# Patient Record
Sex: Female | Born: 1980 | Race: White | Hispanic: No | Marital: Married | Smoking: Never smoker
Health system: Southern US, Community
[De-identification: ages and names within clinical notes are randomized; demographics above are authoritative.]

## PROBLEM LIST (undated history)

## (undated) DIAGNOSIS — IMO0002 Reserved for concepts with insufficient information to code with codable children: Secondary | ICD-10-CM

## (undated) DIAGNOSIS — R0602 Shortness of breath: Secondary | ICD-10-CM

## (undated) DIAGNOSIS — B999 Unspecified infectious disease: Secondary | ICD-10-CM

## (undated) DIAGNOSIS — K219 Gastro-esophageal reflux disease without esophagitis: Secondary | ICD-10-CM

## (undated) DIAGNOSIS — J45909 Unspecified asthma, uncomplicated: Secondary | ICD-10-CM

## (undated) DIAGNOSIS — O4100X0 Oligohydramnios, unspecified trimester, not applicable or unspecified: Secondary | ICD-10-CM

## (undated) DIAGNOSIS — O4590 Premature separation of placenta, unspecified, unspecified trimester: Secondary | ICD-10-CM

## (undated) HISTORY — PX: COLPOSCOPY: SHX161

## (undated) HISTORY — DX: Shortness of breath: R06.02

## (undated) HISTORY — DX: Unspecified asthma, uncomplicated: J45.909

## (undated) HISTORY — PX: WISDOM TOOTH EXTRACTION: SHX21

## (undated) HISTORY — PX: LEEP: SHX91

## (undated) HISTORY — DX: Unspecified infectious disease: B99.9

## (undated) HISTORY — DX: Reserved for concepts with insufficient information to code with codable children: IMO0002

---

## 2007-06-15 DIAGNOSIS — R87619 Unspecified abnormal cytological findings in specimens from cervix uteri: Secondary | ICD-10-CM

## 2007-06-15 DIAGNOSIS — IMO0002 Reserved for concepts with insufficient information to code with codable children: Secondary | ICD-10-CM

## 2007-06-15 HISTORY — DX: Reserved for concepts with insufficient information to code with codable children: IMO0002

## 2007-06-15 HISTORY — DX: Unspecified abnormal cytological findings in specimens from cervix uteri: R87.619

## 2012-01-17 LAB — OB RESULTS CONSOLE HEPATITIS B SURFACE ANTIGEN: Hepatitis B Surface Ag: NEGATIVE

## 2012-01-17 LAB — OB RESULTS CONSOLE RPR: RPR: NONREACTIVE

## 2012-02-22 ENCOUNTER — Ambulatory Visit (INDEPENDENT_AMBULATORY_CARE_PROVIDER_SITE_OTHER): Payer: BC Managed Care – PPO | Admitting: Obstetrics and Gynecology

## 2012-02-22 ENCOUNTER — Other Ambulatory Visit: Payer: Self-pay | Admitting: Obstetrics and Gynecology

## 2012-02-22 ENCOUNTER — Ambulatory Visit (INDEPENDENT_AMBULATORY_CARE_PROVIDER_SITE_OTHER): Payer: BC Managed Care – PPO

## 2012-02-22 ENCOUNTER — Encounter: Payer: Self-pay | Admitting: Obstetrics and Gynecology

## 2012-02-22 VITALS — Wt 171.0 lb

## 2012-02-22 DIAGNOSIS — Z36 Encounter for antenatal screening of mother: Secondary | ICD-10-CM

## 2012-02-22 LAB — US OB COMP LESS 14 WKS

## 2012-02-22 NOTE — Progress Notes (Signed)
NOB interview. Pt transferring from [place] with records .Desires 1st trimester screen. Scheduled today.

## 2012-02-29 ENCOUNTER — Ambulatory Visit (INDEPENDENT_AMBULATORY_CARE_PROVIDER_SITE_OTHER): Payer: BC Managed Care – PPO | Admitting: Obstetrics and Gynecology

## 2012-02-29 ENCOUNTER — Encounter: Payer: Self-pay | Admitting: Obstetrics and Gynecology

## 2012-02-29 ENCOUNTER — Ambulatory Visit (INDEPENDENT_AMBULATORY_CARE_PROVIDER_SITE_OTHER): Payer: Self-pay | Admitting: Family

## 2012-02-29 ENCOUNTER — Encounter: Payer: Self-pay | Admitting: Family

## 2012-02-29 VITALS — BP 90/60 | Ht 70.0 in | Wt 173.0 lb

## 2012-02-29 VITALS — BP 118/80 | HR 97 | Temp 98.5°F | Ht 70.0 in | Wt 174.0 lb

## 2012-02-29 DIAGNOSIS — Z3400 Encounter for supervision of normal first pregnancy, unspecified trimester: Secondary | ICD-10-CM

## 2012-02-29 DIAGNOSIS — Z9889 Other specified postprocedural states: Secondary | ICD-10-CM | POA: Insufficient documentation

## 2012-02-29 DIAGNOSIS — J45909 Unspecified asthma, uncomplicated: Secondary | ICD-10-CM

## 2012-02-29 DIAGNOSIS — Z111 Encounter for screening for respiratory tuberculosis: Secondary | ICD-10-CM

## 2012-02-29 DIAGNOSIS — O3440 Maternal care for other abnormalities of cervix, unspecified trimester: Secondary | ICD-10-CM | POA: Insufficient documentation

## 2012-02-29 DIAGNOSIS — Z23 Encounter for immunization: Secondary | ICD-10-CM

## 2012-02-29 DIAGNOSIS — Z124 Encounter for screening for malignant neoplasm of cervix: Secondary | ICD-10-CM

## 2012-02-29 DIAGNOSIS — Z3689 Encounter for other specified antenatal screening: Secondary | ICD-10-CM

## 2012-02-29 NOTE — Progress Notes (Signed)
Subjective:    Patient ID: Krystal Stanley, female    DOB: 08/06/1980, 31 y.o.   MRN: [id number]  HPI 31 year old new patient is in to be established. She is in school for physical therapy assistant, and needs verification of MMR titer, varicella, hepatitis B. She is currently [redacted] weeks gestation. She's seeing OB/GYN for her gynecological care. Denies any concerns today.   Review of Systems  Constitutional: Negative.   HENT: Negative.   Respiratory: Negative.   Cardiovascular: Negative.   Gastrointestinal: Negative.   Genitourinary: Negative.   Musculoskeletal: Negative.   Skin: Negative.   Neurological: Negative.   Hematological: Negative.   Psychiatric/Behavioral: Negative.    Past Medical History  Diagnosis Date  . Infection CHILDHOOD    UTI  . Shortness of breath   . Asthma AGE 11    INHALER PRN  . Abnormal Pap smear 2009    COLPO; LEEP;  NL SINCE LAST PAP 04/2011    History   Social History  . Marital Status: Married    Spouse Name: ERIC    Number of Children: 0  . Years of Education: 18   Occupational History  . ATHLETIC TRAINER    Social History Main Topics  . Smoking status: Never Smoker   . Smokeless tobacco: Never Used  . Alcohol Use: No     BEER/WINE  D/'C'D WITH +UPT  . Drug Use: No  . Sexually Active: Yes -- Female partner(s)    Birth Control/ Protection: OCP, None     D/C'D 07/2011   Other Topics Concern  . Not on file   Social History Narrative  . No narrative on file    Past Surgical History  Procedure Date  . Wisdom tooth extraction AS TEEN    Family History  Problem Relation Age of Onset  . Early death Brother 35    ACCIDENT  . Rheum arthritis Maternal Aunt   . Arthritis Maternal Grandmother   . Hyperlipidemia Maternal Grandmother   . Heart disease Maternal Grandmother   . Arthritis Maternal Grandfather   . Alcohol abuse Maternal Grandfather   . Arthritis Paternal Grandmother   . Arthritis Paternal Grandfather   . Alcohol abuse  Paternal Grandfather   . Hyperlipidemia Paternal Grandfather   . Heart disease Paternal Grandfather   . Fibromyalgia Maternal Aunt     Allergies  Allergen Reactions  . Other     SEASONAL AND PET DANDER - ASTHMA ; HIVES, ITCHING, WATERY EYES    Current Outpatient Prescriptions on File Prior to Visit  Medication Sig Dispense Refill  . ALBUTEROL SULFATE HFA IN Inhale 1 puff into the lungs as needed.      . cetirizine (ZYRTEC) 10 MG tablet Take 10 mg by mouth daily.      . montelukast (SINGULAIR) 10 MG tablet Take 10 mg by mouth at bedtime.      . Prenatal Vit-Fe Fumarate-FA (PRENATAL 19 PO) Take 1 tablet by mouth. OTC        BP 118/80  Pulse 97  Temp 98.5 F (36.9 C) (Oral)  Ht 5\' 10"  (1.778 m)  Wt 174 lb (78.926 kg)  BMI 24.97 kg/m2  SpO2 99%  LMP 06/13/2013chart    Objective:   Physical Exam  Constitutional: She is oriented to person, place, and time. She appears well-developed and well-nourished.  HENT:  Right Ear: External ear normal.  Left Ear: External ear normal.  Nose: Nose normal.  Mouth/Throat: Oropharynx is clear and moist.  Neck: Normal range  of motion. Neck supple.  Cardiovascular: Normal rate, regular rhythm and normal heart sounds.   Pulmonary/Chest: Effort normal and breath sounds normal.  Abdominal: Soft. Bowel sounds are normal.  Musculoskeletal: Normal range of motion.  Neurological: She is alert and oriented to person, place, and time.  Skin: Skin is warm and dry.  Psychiatric: She has a normal mood and affect.      Influenza vaccine administered  Tetanus administered    Assessment & Plan:  Assessment: Pregnancy, need for vaccination  Plan: Lab sent to include MMR,  Varicella, hep B titer all sent. Will notify patient pending results. Continue to followup with OB for obstetric needs. Patient to call the office with any questions or concerns. Recheck a schedule, appearing.

## 2012-02-29 NOTE — Progress Notes (Signed)
Records attached to chart Pap due in Oct per pt

## 2012-02-29 NOTE — Addendum Note (Signed)
Addended by: Janeece Agee on: 02/29/2012 10:38 AM   Modules accepted: Orders

## 2012-02-29 NOTE — Addendum Note (Signed)
Addended by: Janeece Agee on: 02/29/2012 12:36 PM   Modules accepted: Orders

## 2012-02-29 NOTE — Progress Notes (Signed)
Subjective:    Krystal Stanley is being seen today for her first obstetrical visit at [redacted]w[redacted]d gestation by LMP and 1st trimester screen. Transfer in from [place]--records with patient and reviewed.  No issues.  Seeing primary MD at [facility] for initial establishment visit.  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  She reports no issues.  Her obstetrical history is significant for: Patient Active Problem List  Diagnosis  . Hx LEEP (loop electrosurgical excision procedure), cervix, pregnancy  2009, normal paps since. Pap due in Sept or October.  Relationship with FOB:  Married  Patient does intend to breast feed.   Pregnancy history fully reviewed.  The following portions of the patient's history were reviewed and updated as appropriate: allergies, current medications, past family history, past medical history, past social history, past surgical history and problem list.  Review of Systems Pertinent ROS is described in HPI   Objective:   BP 90/60  Ht 5\' 10"  (1.778 m)  Wt 173 lb (78.472 kg)  BMI 24.82 kg/m2  LMP 11/25/2011 Wt Readings from Last 1 Encounters:  02/29/12 173 lb (78.472 kg)   BMI: Body mass index is 24.82 kg/(m^2).  General: alert, cooperative and no distress Respiratory: clear to auscultation bilaterally Cardiovascular: regular rate and rhythm, S1, S2 normal, no murmur Breasts:  No dominant masses, nipples erect Gastrointestinal: soft, non-tender; no masses,  no organomegaly Extremities: extremities normal, no pain or edema Vaginal Bleeding: None  EXTERNAL GENITALIA: normal appearing vulva with no masses, tenderness or lesions VAGINA: no abnormal discharge or lesions CERVIX: no lesions or cervical motion tenderness; cervix closed, long, firm. UTERUS: gravid and consistent with 13 weeks, but retroverted. Used bedside US for FHR check--+FM, FHR 160. ADNEXA: no masses palpable and nontender OB EXAM  PELVIMETRY: appears adequate   FHR:  160  bpm  Assessment:    Pregnancy at  13 5/7 weeks Hx LEEP 2009 Transfer in from [place] records. Plan:     Prenatal panel reviewed and discussed with the patient:yes Pap smear collected:yes GC/Chlamydia collected:  Declined at previous office. Wet prep:  NA Discussion of Genetic testing options: Had 1st trimester screen (pending), plans AFP Prenatal vitamins recommended Problem list reviewed and updated.  Plan of care: Follow up in 4 weeks for anatomy US and ROB Pap today. AFP NV.  Nigel Bridgeman CNM, [place] 02/29/2012 10:02 AM

## 2012-03-02 LAB — TB SKIN TEST: TB Skin Test: NEGATIVE

## 2012-03-02 LAB — PAP IG, CT-NG, RFX HPV ASCU

## 2012-03-08 ENCOUNTER — Telehealth: Payer: Self-pay | Admitting: *Deleted

## 2012-03-08 NOTE — Telephone Encounter (Signed)
Called patient and left a message. She needs to come back in for more blood work.

## 2012-03-10 NOTE — Addendum Note (Signed)
Addended by: Rita Ohara R on: 03/10/2012 01:24 PM   Modules accepted: Orders

## 2012-03-16 ENCOUNTER — Telehealth: Payer: Self-pay | Admitting: Family

## 2012-03-16 NOTE — Telephone Encounter (Signed)
Pt would like a copy of blood work hep b titer fax to her at [phone] today

## 2012-03-16 NOTE — Telephone Encounter (Signed)
Labs faxed to number provided by pt

## 2012-03-30 ENCOUNTER — Encounter: Payer: Self-pay | Admitting: Obstetrics and Gynecology

## 2012-03-30 ENCOUNTER — Ambulatory Visit (INDEPENDENT_AMBULATORY_CARE_PROVIDER_SITE_OTHER): Payer: BC Managed Care – PPO

## 2012-03-30 ENCOUNTER — Ambulatory Visit (INDEPENDENT_AMBULATORY_CARE_PROVIDER_SITE_OTHER): Payer: BC Managed Care – PPO | Admitting: Obstetrics and Gynecology

## 2012-03-30 VITALS — BP 100/62 | Wt 176.0 lb

## 2012-03-30 DIAGNOSIS — Z3689 Encounter for other specified antenatal screening: Secondary | ICD-10-CM

## 2012-03-30 DIAGNOSIS — Z331 Pregnant state, incidental: Secondary | ICD-10-CM

## 2012-03-30 DIAGNOSIS — Z3490 Encounter for supervision of normal pregnancy, unspecified, unspecified trimester: Secondary | ICD-10-CM

## 2012-03-30 MED ORDER — MONTELUKAST SODIUM 10 MG PO TABS: 10.0000 mg | ORAL_TABLET | Freq: Every day | ORAL | Status: DC

## 2012-03-30 NOTE — Addendum Note (Signed)
Addended by: Marla Roe A on: 03/30/2012 10:14 AM   Modules accepted: Orders

## 2012-03-30 NOTE — Progress Notes (Addendum)
[redacted]w[redacted]d Reviewed PNL AFP today Questions answered RTO 4wks Ultrasound today consistent with dates and cervix 3.2 cm normal fluid no anomaliesl posterior placenta female

## 2012-04-03 LAB — ALPHA FETOPROTEIN, MATERNAL
Open Spina bifida: NEGATIVE
Osb Risk: 1:3700 {titer}

## 2012-04-04 ENCOUNTER — Other Ambulatory Visit: Payer: Self-pay | Admitting: Obstetrics and Gynecology

## 2012-04-04 ENCOUNTER — Telehealth: Payer: Self-pay | Admitting: Obstetrics and Gynecology

## 2012-04-04 NOTE — Telephone Encounter (Signed)
Tc to pt regarding msg.  Lm on vm to call back. 

## 2012-04-05 ENCOUNTER — Other Ambulatory Visit: Payer: Self-pay | Admitting: Obstetrics and Gynecology

## 2012-04-05 LAB — US OB COMP + 14 WK

## 2012-04-05 NOTE — Telephone Encounter (Signed)
Pt returned call regarding msg.  Informed pt rx was e-prescribed to her pharmacy on 03/30/12 by AR, pt says she did not get a call from pharmacy, advised to call her pharmacy to make sure that they did receive it, but it was completed and shows that it went through.  Pt voices agreement.

## 2012-04-11 ENCOUNTER — Telehealth: Payer: Self-pay

## 2012-04-17 NOTE — Telephone Encounter (Signed)
Note to close encounter only. Sina Lucchesi, Jacqueline A  

## 2012-04-26 ENCOUNTER — Ambulatory Visit (INDEPENDENT_AMBULATORY_CARE_PROVIDER_SITE_OTHER): Payer: BC Managed Care – PPO | Admitting: Obstetrics and Gynecology

## 2012-04-26 ENCOUNTER — Encounter: Payer: Self-pay | Admitting: Obstetrics and Gynecology

## 2012-04-26 VITALS — BP 118/62 | Wt 181.0 lb

## 2012-04-26 DIAGNOSIS — Z331 Pregnant state, incidental: Secondary | ICD-10-CM

## 2012-04-26 NOTE — Patient Instructions (Signed)
ABCs of Pregnancy  A  Antepartum care is very important. Be sure you see your doctor and get prenatal care as soon as you think you are pregnant. At this time, you will be tested for infection, genetic abnormalities and potential problems with you and the pregnancy. This is the time to discuss diet, exercise, work, medications, labor, pain medication during labor and the possibility of a cesarean delivery. Ask any questions that may concern you. It is important to see your doctor regularly throughout your pregnancy. Avoid exposure to toxic substances and chemicals - such as cleaning solvents, lead and mercury, some insecticides, and paint. Pregnant women should avoid exposure to paint fumes, and fumes that cause you to feel ill, dizzy or faint. When possible, it is a good idea to have a pre-pregnancy consultation with your caregiver to begin some important recommendations your caregiver suggests such as, taking folic acid, exercising, quitting smoking, avoiding alcoholic beverages, etc.  B  Breastfeeding is the healthiest choice for both you and your baby. It has many nutritional benefits for the baby and health benefits for the mother. It also creates a very tight and loving bond between the baby and mother. Talk to your doctor, your family and friends, and your employer about how you choose to feed your baby and how they can support you in your decision. Not all birth defects can be prevented, but a woman can take actions that may increase her chance of having a healthy baby. Many birth defects happen very early in pregnancy, sometimes before a woman even knows she is pregnant. Birth defects or abnormalities of any child in your or the father's family should be discussed with your caregiver. Get a good support bra as your breast size changes. Wear it especially when you exercise and when nursing.   C  Celebrate the news of your pregnancy with the your spouse/father and family. Childbirth classes are helpful to  take for you and the spouse/father because it helps to understand what happens during the pregnancy, labor and delivery. Cesarean delivery should be discussed with your doctor so you are prepared for that possibility. The pros and cons of circumcision if it is a boy, should be discussed with your pediatrician. Cigarette smoking during pregnancy can result in low birth weight babies. It has been associated with infertility, miscarriages, tubal pregnancies, infant death (mortality) and poor health (morbidity) in childhood. Additionally, cigarette smoking may cause long-term learning disabilities. If you smoke, you should try to quit before getting pregnant and not smoke during the pregnancy. Secondary smoke may also harm a mother and her developing baby. It is a good idea to ask people to stop smoking around you during your pregnancy and after the baby is born. Extra calcium is necessary when you are pregnant and is found in your prenatal vitamin, in dairy products, green leafy vegetables and in calcium supplements.  D  A healthy diet according to your current weight and height, along with vitamins and mineral supplements should be discussed with your caregiver. Domestic abuse or violence should be made known to your doctor right away to get the situation corrected. Drink more water when you exercise to keep hydrated. Discomfort of your back and legs usually develops and progresses from the middle of the second trimester through to delivery of the baby. This is because of the enlarging baby and uterus, which may also affect your balance. Do not take illegal drugs. Illegal drugs can seriously harm the baby and you. Drink extra   fluids (water is best) throughout pregnancy to help your body keep up with the increases in your blood volume. Drink at least 6 to 8 glasses of water, fruit juice, or milk each day. A good way to know you are drinking enough fluid is when your urine looks almost like clear water or is very light  yellow.   E  Eat healthy to get the nutrients you and your unborn baby need. Your meals should include the five basic food groups. Exercise (30 minutes of light to moderate exercise a day) is important and encouraged during pregnancy, if there are no medical problems or problems with the pregnancy. Exercise that causes discomfort or dizziness should be stopped and reported to your caregiver. Emotions during pregnancy can change from being ecstatic to depression and should be understood by you, your partner and your family.  F  Fetal screening with ultrasound, amniocentesis and monitoring during pregnancy and labor is common and sometimes necessary. Take 400 micrograms of folic acid daily both before, when possible, and during the first few months of pregnancy to reduce the risk of birth defects of the brain and spine. All women who could possibly become pregnant should take a vitamin with folic acid, every day. It is also important to eat a healthy diet with fortified foods (enriched grain products, including cereals, rice, breads, and pastas) and foods with natural sources of folate (orange juice, green leafy vegetables, beans, peanuts, broccoli, asparagus, peas, and lentils). The father should be involved with all aspects of the pregnancy including, the prenatal care, childbirth classes, labor, delivery, and postpartum time. Fathers may also have emotional concerns about being a father, financial needs, and raising a family.  G  Genetic testing should be done appropriately. It is important to know your family and the father's history. If there have been problems with pregnancies or birth defects in your family, report these to your doctor. Also, genetic counselors can talk with you about the information you might need in making decisions about having a family. You can call a major medical center in your area for help in finding a board-certified genetic counselor. Genetic testing and counseling should be done  before pregnancy when possible, especially if there is a history of problems in the mother's or father's family. Certain ethnic backgrounds are more at risk for genetic defects.  H  Get familiar with the hospital where you will be having your baby. Get to know how long it takes to get there, the labor and delivery area, and the hospital procedures. Be sure your medical insurance is accepted there. Get your home ready for the baby including, clothes, the baby's room (when possible), furniture and car seat. Hand washing is important throughout the day, especially after handling raw meat and poultry, changing the baby's diaper or using the bathroom. This can help prevent the spread of many bacteria and viruses that cause infection. Your hair may become dry and thinner, but will return to normal a few weeks after the baby is born. Heartburn is a common problem that can be treated by taking antacids recommended by your caregiver, eating smaller meals 5 or 6 times a day, not drinking liquids when eating, drinking between meals and raising the head of your bed 2 to 3 inches.  I  Insurance to cover you, the baby, doctor and hospital should be reviewed so that you will be prepared to pay any costs not covered by your insurance plan. If you do not have medical insurance,   there are usually clinics and services available for you in your community. Take 30 milligrams of iron during your pregnancy as prescribed by your doctor to reduce the risk of low red blood cells (anemia) later in pregnancy. All women of childbearing age should eat a diet rich in iron.  J  There should be a joint effort for the mother, father and any other children to adapt to the pregnancy financially, emotionally, and psychologically during the pregnancy. Join a support group for moms-to-be. Or, join a class on parenting or childbirth. Have the family participate when possible.  K  Know your limits. Let your caregiver know if you experience any of the  following:   · Pain of any kind.  · Strong cramps.  · You develop a lot of weight in a short period of time (5 pounds in 3 to 5 days).  · Vaginal bleeding, leaking of amniotic fluid.  · Headache, vision problems.  · Dizziness, fainting, shortness of breath.  · Chest pain.  · Fever of 102° F (38.9° C) or higher.  · Gush of clear fluid from your vagina.  · Painful urination.  · Domestic violence.  · Irregular heartbeat (palpitations).  · Rapid beating of the heart (tachycardia).  · Constant feeling sick to your stomach (nauseous) and vomiting.  · Trouble walking, fluid retention (edema).  · Muscle weakness.  · If your baby has decreased activity.  · Persistent diarrhea.  · Abnormal vaginal discharge.  · Uterine contractions at 20-minute intervals.  · Back pain that travels down your leg.  L  Learn and practice that what you eat and drink should be in moderation and healthy for you and your baby. Legal drugs such as alcohol and caffeine are important issues for pregnant women. There is no safe amount of alcohol a woman can drink while pregnant. Fetal alcohol syndrome, a disorder characterized by growth retardation, facial abnormalities, and central nervous system dysfunction, is caused by a woman's use of alcohol during pregnancy. Caffeine, found in tea, coffee, soft drinks and chocolate, should also be limited. Be sure to read labels when trying to cut down on caffeine during pregnancy. More than 200 foods, beverages, and over-the-counter medications contain caffeine and have a high salt content! There are coffees and teas that do not contain caffeine.  M  Medical conditions such as diabetes, epilepsy, and high blood pressure should be treated and kept under control before pregnancy when possible, but especially during pregnancy. Ask your caregiver about any medications that may need to be changed or adjusted during pregnancy. If you are currently taking any medications, ask your caregiver if it is safe to take them  while you are pregnant or before getting pregnant when possible. Also, be sure to discuss any herbs or vitamins you are taking. They are medicines, too! Discuss with your doctor all medications, prescribed and over-the-counter, that you are taking. During your prenatal visit, discuss the medications your doctor may give you during labor and delivery.  N  Never be afraid to ask your doctor or caregiver questions about your health, the progress of the pregnancy, family problems, stressful situations, and recommendation for a pediatrician, if you do not have one. It is better to take all precautions and discuss any questions or concerns you may have during your office visits. It is a good idea to write down your questions before you visit the doctor.  O  Over-the-counter cough and cold remedies may contain alcohol or other ingredients that should   be avoided during pregnancy. Ask your caregiver about prescription, herbs or over-the-counter medications that you are taking or may consider taking while pregnant.   P  Physical activity during pregnancy can benefit both you and your baby by lessening discomfort and fatigue, providing a sense of well-being, and increasing the likelihood of early recovery after delivery. Light to moderate exercise during pregnancy strengthens the belly (abdominal) and back muscles. This helps improve posture. Practicing yoga, walking, swimming, and cycling on a stationary bicycle are usually safe exercises for pregnant women. Avoid scuba diving, exercise at high altitudes (over 3000 feet), skiing, horseback riding, contact sports, etc. Always check with your doctor before beginning any kind of exercise, especially during pregnancy and especially if you did not exercise before getting pregnant.  Q  Queasiness, stomach upset and morning sickness are common during pregnancy. Eating a couple of crackers or dry toast before getting out of bed. Foods that you normally love may make you feel sick to  your stomach. You may need to substitute other nutritious foods. Eating 5 or 6 small meals a day instead of 3 large ones may make you feel better. Do not drink with your meals, drink between meals. Questions that you have should be written down and asked during your prenatal visits.  R  Read about and make plans to baby-proof your home. There are important tips for making your home a safer environment for your baby. Review the tips and make your home safer for you and your baby. Read food labels regarding calories, salt and fat content in the food.  S  Saunas, hot tubs, and steam rooms should be avoided while you are pregnant. Excessive high heat may be harmful during your pregnancy. Your caregiver will screen and examine you for sexually transmitted diseases and genetic disorders during your prenatal visits. Learn the signs of labor. Sexual relations while pregnant is safe unless there is a medical or pregnancy problem and your caregiver advises against it.  T  Traveling long distances should be avoided especially in the third trimester of your pregnancy. If you do have to travel out of state, be sure to take a copy of your medical records and medical insurance plan with you. You should not travel long distances without seeing your doctor first. Most airlines will not allow you to travel after 36 weeks of pregnancy. Toxoplasmosis is an infection caused by a parasite that can seriously harm an unborn baby. Avoid eating undercooked meat and handling cat litter. Be sure to wear gloves when gardening. Tingling of the hands and fingers is not unusual and is due to fluid retention. This will go away after the baby is born.  U  Womb (uterus) size increases during the first trimester. Your kidneys will begin to function more efficiently. This may cause you to feel the need to urinate more often. You may also leak urine when sneezing, coughing or laughing. This is due to the growing uterus pressing against your bladder,  which lies directly in front of and slightly under the uterus during the first few months of pregnancy. If you experience burning along with frequency of urination or bloody urine, be sure to tell your doctor. The size of your uterus in the third trimester may cause a problem with your balance. It is advisable to maintain good posture and avoid wearing high heels during this time. An ultrasound of your baby may be necessary during your pregnancy and is safe for you and your baby.  V    Vaccinations are an important concern for pregnant women. Get needed vaccines before pregnancy. Center for Disease Control (www.cdc.gov) has clear guidelines for the use of vaccines during pregnancy. Review the list, be sure to discuss it with your doctor. Prenatal vitamins are helpful and healthy for you and the baby. Do not take extra vitamins except what is recommended. Taking too much of certain vitamins can cause overdose problems. Continuous vomiting should be reported to your caregiver. Varicose veins may appear especially if there is a family history of varicose veins. They should subside after the delivery of the baby. Support hose helps if there is leg discomfort.  W  Being overweight or underweight during pregnancy may cause problems. Try to get within 15 pounds of your ideal weight before pregnancy. Remember, pregnancy is not a time to be dieting! Do not stop eating or start skipping meals as your weight increases. Both you and your baby need the calories and nutrition you receive from a healthy diet. Be sure to consult with your doctor about your diet. There is a formula and diet plan available depending on whether you are overweight or underweight. Your caregiver or nutritionist can help and advise you if necessary.  X  Avoid X-rays. If you must have dental work or diagnostic tests, tell your dentist or physician that you are pregnant so that extra care can be taken. X-rays should only be taken when the risks of not taking  them outweigh the risk of taking them. If needed, only the minimum amount of radiation should be used. When X-rays are necessary, protective lead shields should be used to cover areas of the body that are not being X-rayed.  Y  Your baby loves you. Breastfeeding your baby creates a loving and very close bond between the two of you. Give your baby a healthy environment to live in while you are pregnant. Infants and children require constant care and guidance. Their health and safety should be carefully watched at all times. After the baby is born, rest or take a nap when the baby is sleeping.  Z  Get your ZZZs. Be sure to get plenty of rest. Resting on your side as often as possible, especially on your left side is advised. It provides the best circulation to your baby and helps reduce swelling. Try taking a nap for 30 to 45 minutes in the afternoon when possible. After the baby is born rest or take a nap when the baby is sleeping. Try elevating your feet for that amount of time when possible. It helps the circulation in your legs and helps reduce swelling.   Most information courtesy of the CDC.  Document Released: 05/31/2005 Document Revised: 08/23/2011 Document Reviewed: 02/12/2009  [employer]® Patient Information ©2013 [employer].

## 2012-04-26 NOTE — Progress Notes (Signed)
[redacted]w[redacted]d  Pt without c/o AFP neg

## 2012-04-26 NOTE — Progress Notes (Signed)
Pt w/o complaint, has already been given flu vaccine.

## 2012-04-27 ENCOUNTER — Encounter: Payer: BC Managed Care – PPO | Admitting: Obstetrics and Gynecology

## 2012-05-25 ENCOUNTER — Encounter: Payer: Self-pay | Admitting: Obstetrics and Gynecology

## 2012-05-25 ENCOUNTER — Ambulatory Visit (INDEPENDENT_AMBULATORY_CARE_PROVIDER_SITE_OTHER): Payer: BC Managed Care – PPO | Admitting: Obstetrics and Gynecology

## 2012-05-25 VITALS — BP 112/70 | Wt 184.0 lb

## 2012-05-25 DIAGNOSIS — Z331 Pregnant state, incidental: Secondary | ICD-10-CM

## 2012-05-25 NOTE — Patient Instructions (Signed)
Fetal Movement Counts Patient Name: __________________________________________________ Patient Due Date: ____________________ Kick counts is highly recommended in high risk pregnancies, but it is a good idea for every pregnant woman to do. Start counting fetal movements at 28 weeks of the pregnancy. Fetal movements increase after eating a full meal or eating or drinking something sweet (the blood sugar is higher). It is also important to drink plenty of fluids (well hydrated) before doing the count. Lie on your left side because it helps with the circulation or you can sit in a comfortable chair with your arms over your belly (abdomen) with no distractions around you. DOING THE COUNT  Try to do the count the same time of day each time you do it.  Mark the day and time, then see how long it takes for you to feel 10 movements (kicks, flutters, swishes, rolls). You should have at least 10 movements within 2 hours. You will most likely feel 10 movements in much less than 2 hours. If you do not, wait an hour and count again. After a couple of days you will see a pattern.  What you are looking for is a change in the pattern or not enough counts in 2 hours. Is it taking longer in time to reach 10 movements? SEEK MEDICAL CARE IF:  You feel less than 10 counts in 2 hours. Tried twice.  No movement in one hour.  The pattern is changing or taking longer each day to reach 10 counts in 2 hours.  You feel the baby is not moving as it usually does. Date: ____________ Movements: ____________ Start time: ____________ Finish time: ____________  Date: ____________ Movements: ____________ Start time: ____________ Finish time: ____________ Date: ____________ Movements: ____________ Start time: ____________ Finish time: ____________ Date: ____________ Movements: ____________ Start time: ____________ Finish time: ____________ Date: ____________ Movements: ____________ Start time: ____________ Finish time:  ____________ Date: ____________ Movements: ____________ Start time: ____________ Finish time: ____________ Date: ____________ Movements: ____________ Start time: ____________ Finish time: ____________ Date: ____________ Movements: ____________ Start time: ____________ Finish time: ____________  Date: ____________ Movements: ____________ Start time: ____________ Finish time: ____________ Date: ____________ Movements: ____________ Start time: ____________ Finish time: ____________ Date: ____________ Movements: ____________ Start time: ____________ Finish time: ____________ Date: ____________ Movements: ____________ Start time: ____________ Finish time: ____________ Date: ____________ Movements: ____________ Start time: ____________ Finish time: ____________ Date: ____________ Movements: ____________ Start time: ____________ Finish time: ____________ Date: ____________ Movements: ____________ Start time: ____________ Finish time: ____________  Date: ____________ Movements: ____________ Start time: ____________ Finish time: ____________ Date: ____________ Movements: ____________ Start time: ____________ Finish time: ____________ Date: ____________ Movements: ____________ Start time: ____________ Finish time: ____________ Date: ____________ Movements: ____________ Start time: ____________ Finish time: ____________ Date: ____________ Movements: ____________ Start time: ____________ Finish time: ____________ Date: ____________ Movements: ____________ Start time: ____________ Finish time: ____________ Date: ____________ Movements: ____________ Start time: ____________ Finish time: ____________  Date: ____________ Movements: ____________ Start time: ____________ Finish time: ____________ Date: ____________ Movements: ____________ Start time: ____________ Finish time: ____________ Date: ____________ Movements: ____________ Start time: ____________ Finish time: ____________ Date: ____________ Movements:  ____________ Start time: ____________ Finish time: ____________ Date: ____________ Movements: ____________ Start time: ____________ Finish time: ____________ Date: ____________ Movements: ____________ Start time: ____________ Finish time: ____________ Date: ____________ Movements: ____________ Start time: ____________ Finish time: ____________  Date: ____________ Movements: ____________ Start time: ____________ Finish time: ____________ Date: ____________ Movements: ____________ Start time: ____________ Finish time: ____________ Date: ____________ Movements: ____________ Start time: ____________ Finish time: ____________ Date: ____________ Movements:   ____________ Start time: ____________ Finish time: ____________ Date: ____________ Movements: ____________ Start time: ____________ Finish time: ____________ Date: ____________ Movements: ____________ Start time: ____________ Finish time: ____________ Date: ____________ Movements: ____________ Start time: ____________ Finish time: ____________  Date: ____________ Movements: ____________ Start time: ____________ Finish time: ____________ Date: ____________ Movements: ____________ Start time: ____________ Finish time: ____________ Date: ____________ Movements: ____________ Start time: ____________ Finish time: ____________ Date: ____________ Movements: ____________ Start time: ____________ Finish time: ____________ Date: ____________ Movements: ____________ Start time: ____________ Finish time: ____________ Date: ____________ Movements: ____________ Start time: ____________ Finish time: ____________ Date: ____________ Movements: ____________ Start time: ____________ Finish time: ____________  Date: ____________ Movements: ____________ Start time: ____________ Finish time: ____________ Date: ____________ Movements: ____________ Start time: ____________ Finish time: ____________ Date: ____________ Movements: ____________ Start time: ____________ Finish  time: ____________ Date: ____________ Movements: ____________ Start time: ____________ Finish time: ____________ Date: ____________ Movements: ____________ Start time: ____________ Finish time: ____________ Date: ____________ Movements: ____________ Start time: ____________ Finish time: ____________ Date: ____________ Movements: ____________ Start time: ____________ Finish time: ____________  Date: ____________ Movements: ____________ Start time: ____________ Finish time: ____________ Date: ____________ Movements: ____________ Start time: ____________ Finish time: ____________ Date: ____________ Movements: ____________ Start time: ____________ Finish time: ____________ Date: ____________ Movements: ____________ Start time: ____________ Finish time: ____________ Date: ____________ Movements: ____________ Start time: ____________ Finish time: ____________ Date: ____________ Movements: ____________ Start time: ____________ Finish time: ____________ Document Released: 06/30/2006 Document Revised: 08/23/2011 Document Reviewed: 12/31/2008 [employer] Patient Information 2013 [employer].  

## 2012-05-25 NOTE — Progress Notes (Signed)
Glucola given today. Pt c/o pain under ribs.

## 2012-05-25 NOTE — Progress Notes (Signed)
[redacted]w[redacted]d A/P Glucola, hemoglobin and RPR today Fetal kick counts reviewed All patients questions answered Return in two weeks Continue Prenatal vitamins Blood type O pos

## 2012-06-15 ENCOUNTER — Encounter: Payer: BC Managed Care – PPO | Admitting: Obstetrics and Gynecology

## 2012-06-19 ENCOUNTER — Encounter: Payer: Self-pay | Admitting: *Deleted

## 2012-06-19 ENCOUNTER — Ambulatory Visit (INDEPENDENT_AMBULATORY_CARE_PROVIDER_SITE_OTHER): Payer: BC Managed Care – PPO | Admitting: *Deleted

## 2012-06-19 VITALS — BP 114/72 | Wt 190.0 lb

## 2012-06-19 DIAGNOSIS — Z331 Pregnant state, incidental: Secondary | ICD-10-CM

## 2012-06-19 NOTE — Progress Notes (Signed)
Doing well.  C/O baby being in her ribs. Suggested repositioning.  Dicussed s/s early labor.  Kick counts reviewed.

## 2012-06-19 NOTE — Progress Notes (Signed)
[redacted]w[redacted]d  Pt c/o: of baby in her left ribs 1 GTT was 71

## 2012-06-29 ENCOUNTER — Encounter: Payer: BC Managed Care – PPO | Admitting: Obstetrics and Gynecology

## 2012-07-03 ENCOUNTER — Ambulatory Visit: Payer: BC Managed Care – PPO | Admitting: Obstetrics and Gynecology

## 2012-07-03 ENCOUNTER — Encounter: Payer: Self-pay | Admitting: Obstetrics and Gynecology

## 2012-07-03 VITALS — BP 108/62 | Wt 191.0 lb

## 2012-07-03 DIAGNOSIS — Z331 Pregnant state, incidental: Secondary | ICD-10-CM

## 2012-07-03 MED ORDER — ALBUTEROL SULFATE HFA 108 (90 BASE) MCG/ACT IN AERS: 2.0000 | INHALATION_SPRAY | RESPIRATORY_TRACT | Status: AC | PRN

## 2012-07-03 MED ORDER — MONTELUKAST SODIUM 10 MG PO TABS: 10.0000 mg | ORAL_TABLET | Freq: Every day | ORAL | Status: AC

## 2012-07-03 NOTE — Progress Notes (Signed)
[redacted]w[redacted]d Pt without c/o.  She needs refills on her asthma meds.  She has not had any attacks she is just on maintenece [facility]KC reviewed RT 2 weeks

## 2012-07-03 NOTE — Patient Instructions (Signed)
Fetal Movement Counts Patient Name: __________________________________________________ Patient Due Date: ____________________ Kick counts is highly recommended in high risk pregnancies, but it is a good idea for every pregnant woman to do. Start counting fetal movements at 28 weeks of the pregnancy. Fetal movements increase after eating a full meal or eating or drinking something sweet (the blood sugar is higher). It is also important to drink plenty of fluids (well hydrated) before doing the count. Lie on your left side because it helps with the circulation or you can sit in a comfortable chair with your arms over your belly (abdomen) with no distractions around you. DOING THE COUNT  Try to do the count the same time of day each time you do it.  Mark the day and time, then see how long it takes for you to feel 10 movements (kicks, flutters, swishes, rolls). You should have at least 10 movements within 2 hours. You will most likely feel 10 movements in much less than 2 hours. If you do not, wait an hour and count again. After a couple of days you will see a pattern.  What you are looking for is a change in the pattern or not enough counts in 2 hours. Is it taking longer in time to reach 10 movements? SEEK MEDICAL CARE IF:  You feel less than 10 counts in 2 hours. Tried twice.  No movement in one hour.  The pattern is changing or taking longer each day to reach 10 counts in 2 hours.  You feel the baby is not moving as it usually does. Date: ____________ Movements: ____________ Start time: ____________ Finish time: ____________  Date: ____________ Movements: ____________ Start time: ____________ Finish time: ____________ Date: ____________ Movements: ____________ Start time: ____________ Finish time: ____________ Date: ____________ Movements: ____________ Start time: ____________ Finish time: ____________ Date: ____________ Movements: ____________ Start time: ____________ Finish time:  ____________ Date: ____________ Movements: ____________ Start time: ____________ Finish time: ____________ Date: ____________ Movements: ____________ Start time: ____________ Finish time: ____________ Date: ____________ Movements: ____________ Start time: ____________ Finish time: ____________  Date: ____________ Movements: ____________ Start time: ____________ Finish time: ____________ Date: ____________ Movements: ____________ Start time: ____________ Finish time: ____________ Date: ____________ Movements: ____________ Start time: ____________ Finish time: ____________ Date: ____________ Movements: ____________ Start time: ____________ Finish time: ____________ Date: ____________ Movements: ____________ Start time: ____________ Finish time: ____________ Date: ____________ Movements: ____________ Start time: ____________ Finish time: ____________ Date: ____________ Movements: ____________ Start time: ____________ Finish time: ____________  Date: ____________ Movements: ____________ Start time: ____________ Finish time: ____________ Date: ____________ Movements: ____________ Start time: ____________ Finish time: ____________ Date: ____________ Movements: ____________ Start time: ____________ Finish time: ____________ Date: ____________ Movements: ____________ Start time: ____________ Finish time: ____________ Date: ____________ Movements: ____________ Start time: ____________ Finish time: ____________ Date: ____________ Movements: ____________ Start time: ____________ Finish time: ____________ Date: ____________ Movements: ____________ Start time: ____________ Finish time: ____________  Date: ____________ Movements: ____________ Start time: ____________ Finish time: ____________ Date: ____________ Movements: ____________ Start time: ____________ Finish time: ____________ Date: ____________ Movements: ____________ Start time: ____________ Finish time: ____________ Date: ____________ Movements:  ____________ Start time: ____________ Finish time: ____________ Date: ____________ Movements: ____________ Start time: ____________ Finish time: ____________ Date: ____________ Movements: ____________ Start time: ____________ Finish time: ____________ Date: ____________ Movements: ____________ Start time: ____________ Finish time: ____________  Date: ____________ Movements: ____________ Start time: ____________ Finish time: ____________ Date: ____________ Movements: ____________ Start time: ____________ Finish time: ____________ Date: ____________ Movements: ____________ Start time: ____________ Finish time: ____________ Date: ____________ Movements:   ____________ Start time: ____________ Finish time: ____________ Date: ____________ Movements: ____________ Start time: ____________ Finish time: ____________ Date: ____________ Movements: ____________ Start time: ____________ Finish time: ____________ Date: ____________ Movements: ____________ Start time: ____________ Finish time: ____________  Date: ____________ Movements: ____________ Start time: ____________ Finish time: ____________ Date: ____________ Movements: ____________ Start time: ____________ Finish time: ____________ Date: ____________ Movements: ____________ Start time: ____________ Finish time: ____________ Date: ____________ Movements: ____________ Start time: ____________ Finish time: ____________ Date: ____________ Movements: ____________ Start time: ____________ Finish time: ____________ Date: ____________ Movements: ____________ Start time: ____________ Finish time: ____________ Date: ____________ Movements: ____________ Start time: ____________ Finish time: ____________  Date: ____________ Movements: ____________ Start time: ____________ Finish time: ____________ Date: ____________ Movements: ____________ Start time: ____________ Finish time: ____________ Date: ____________ Movements: ____________ Start time: ____________ Finish  time: ____________ Date: ____________ Movements: ____________ Start time: ____________ Finish time: ____________ Date: ____________ Movements: ____________ Start time: ____________ Finish time: ____________ Date: ____________ Movements: ____________ Start time: ____________ Finish time: ____________ Date: ____________ Movements: ____________ Start time: ____________ Finish time: ____________  Date: ____________ Movements: ____________ Start time: ____________ Finish time: ____________ Date: ____________ Movements: ____________ Start time: ____________ Finish time: ____________ Date: ____________ Movements: ____________ Start time: ____________ Finish time: ____________ Date: ____________ Movements: ____________ Start time: ____________ Finish time: ____________ Date: ____________ Movements: ____________ Start time: ____________ Finish time: ____________ Date: ____________ Movements: ____________ Start time: ____________ Finish time: ____________ Document Released: 06/30/2006 Document Revised: 08/23/2011 Document Reviewed: 12/31/2008 [employer] Patient Information 2013 [employer].  

## 2012-07-18 ENCOUNTER — Ambulatory Visit: Payer: BC Managed Care – PPO | Admitting: Obstetrics and Gynecology

## 2012-07-18 ENCOUNTER — Encounter: Payer: Self-pay | Admitting: Obstetrics and Gynecology

## 2012-07-18 VITALS — BP 110/60 | Wt 193.0 lb

## 2012-07-18 DIAGNOSIS — Z331 Pregnant state, incidental: Secondary | ICD-10-CM

## 2012-07-18 NOTE — Progress Notes (Signed)
[redacted]w[redacted]d Pt without c/o GBS@[place] 

## 2012-08-01 ENCOUNTER — Encounter: Payer: Self-pay | Admitting: Obstetrics and Gynecology

## 2012-08-01 ENCOUNTER — Ambulatory Visit: Payer: BC Managed Care – PPO | Admitting: Obstetrics and Gynecology

## 2012-08-01 VITALS — BP 120/70 | Wt 194.0 lb

## 2012-08-01 DIAGNOSIS — Z331 Pregnant state, incidental: Secondary | ICD-10-CM

## 2012-08-01 NOTE — Progress Notes (Signed)
[redacted]w[redacted]d Feels baby is very low in pelvis--verified on pelvic exam, with vtx at -1 station, cervix closed and long. Plans epidural in labor. GBS today.

## 2012-08-01 NOTE — Progress Notes (Signed)
[redacted]w[redacted]d No concerns today per pt

## 2012-08-07 ENCOUNTER — Encounter (HOSPITAL_COMMUNITY): Payer: Self-pay | Admitting: *Deleted

## 2012-08-07 ENCOUNTER — Inpatient Hospital Stay (HOSPITAL_COMMUNITY)
Admission: AD | Admit: 2012-08-07 | Discharge: 2012-08-10 | DRG: 372 | Disposition: A | Discharge status: Home / Self Care | Payer: BC Managed Care – PPO | Source: Ambulatory Visit | Attending: Obstetrics and Gynecology | Admitting: Obstetrics and Gynecology

## 2012-08-07 ENCOUNTER — Telehealth: Payer: Self-pay | Admitting: Obstetrics and Gynecology

## 2012-08-07 ENCOUNTER — Inpatient Hospital Stay (HOSPITAL_COMMUNITY): Payer: BC Managed Care – PPO

## 2012-08-07 DIAGNOSIS — J45909 Unspecified asthma, uncomplicated: Secondary | ICD-10-CM

## 2012-08-07 DIAGNOSIS — O4100X0 Oligohydramnios, unspecified trimester, not applicable or unspecified: Secondary | ICD-10-CM

## 2012-08-07 DIAGNOSIS — O4693 Antepartum hemorrhage, unspecified, third trimester: Secondary | ICD-10-CM

## 2012-08-07 DIAGNOSIS — O4590 Premature separation of placenta, unspecified, unspecified trimester: Principal | ICD-10-CM | POA: Diagnosis present

## 2012-08-07 HISTORY — DX: Premature separation of placenta, unspecified, unspecified trimester: O45.90

## 2012-08-07 HISTORY — DX: Gastro-esophageal reflux disease without esophagitis: K21.9

## 2012-08-07 HISTORY — DX: Oligohydramnios, unspecified trimester, not applicable or unspecified: O41.00X0

## 2012-08-07 LAB — CBC
HCT: 39.3 % (ref 36.0–46.0)
MCH: 31 pg (ref 26.0–34.0)
MCHC: 35.6 g/dL (ref 30.0–36.0)
MCV: 86.9 fL (ref 78.0–100.0)
Platelets: 219 10*3/uL (ref 150–400)
RDW: 12.8 % (ref 11.5–15.5)
WBC: 18.8 10*3/uL — ABNORMAL HIGH (ref 4.0–10.5)

## 2012-08-07 LAB — URINALYSIS, ROUTINE W REFLEX MICROSCOPIC
Bilirubin Urine: NEGATIVE
Glucose, UA: NEGATIVE mg/dL
Leukocytes, UA: NEGATIVE
Nitrite: NEGATIVE
Specific Gravity, Urine: >1.03 — ABNORMAL HIGH (ref 1.005–1.030)
pH: 6 (ref 5.0–8.0)

## 2012-08-07 LAB — COMPREHENSIVE METABOLIC PANEL
AST: 18 U/L (ref 0–37)
Albumin: 2.8 g/dL — ABNORMAL LOW (ref 3.5–5.2)
BUN: 11 mg/dL (ref 6–23)
Calcium: 9.8 mg/dL (ref 8.4–10.5)
Chloride: 100 mEq/L (ref 96–112)
Creatinine, Ser: 0.71 mg/dL (ref 0.50–1.10)
Total Bilirubin: 0.6 mg/dL (ref 0.3–1.2)

## 2012-08-07 LAB — URINE MICROSCOPIC-ADD ON

## 2012-08-07 LAB — ABO/RH: ABO/RH(D): O POS

## 2012-08-07 LAB — PREPARE RBC (CROSSMATCH)

## 2012-08-07 MED ORDER — ZOLPIDEM TARTRATE 5 MG PO TABS: 5.0000 mg | ORAL_TABLET | Freq: Once | ORAL | Status: DC

## 2012-08-07 MED ORDER — PRENATAL MULTIVITAMIN CH
1.0000 | ORAL_TABLET | Freq: Every day | ORAL | Status: DC
  Administered 2012-08-08: 1 via ORAL
  Filled 2012-08-07: qty 1

## 2012-08-07 MED ORDER — CITRIC ACID-SODIUM CITRATE 334-500 MG/5ML PO SOLN: 30.0000 mL | ORAL | Status: DC | PRN

## 2012-08-07 MED ORDER — LACTATED RINGERS IV SOLN
INTRAVENOUS | Status: DC
  Administered 2012-08-07 – 2012-08-08 (×2): via INTRAVENOUS

## 2012-08-07 MED ORDER — IBUPROFEN 600 MG PO TABS: 600.0000 mg | ORAL_TABLET | Freq: Four times a day (QID) | ORAL | Status: DC | PRN

## 2012-08-07 MED ORDER — ACETAMINOPHEN 325 MG PO TABS: 650.0000 mg | ORAL_TABLET | ORAL | Status: DC | PRN

## 2012-08-07 MED ORDER — LACTATED RINGERS IV SOLN: INTRAVENOUS | Status: DC

## 2012-08-07 MED ORDER — CALCIUM CARBONATE ANTACID 500 MG PO CHEW: 2.0000 | CHEWABLE_TABLET | ORAL | Status: DC | PRN

## 2012-08-07 MED ORDER — FLEET ENEMA 7-19 GM/118ML RE ENEM: 1.0000 | ENEMA | RECTAL | Status: DC | PRN

## 2012-08-07 MED ORDER — OXYTOCIN 40 UNITS IN LACTATED RINGERS INFUSION - SIMPLE MED: 62.5000 mL/h | INTRAVENOUS | Status: DC

## 2012-08-07 MED ORDER — BUTORPHANOL TARTRATE 1 MG/ML IJ SOLN
1.0000 mg | INTRAMUSCULAR | Status: DC | PRN
  Administered 2012-08-08: 1 mg via INTRAVENOUS
  Filled 2012-08-07: qty 1

## 2012-08-07 MED ORDER — LIDOCAINE HCL (PF) 1 % IJ SOLN: 30.0000 mL | INTRAMUSCULAR | Status: DC | PRN

## 2012-08-07 MED ORDER — ZOLPIDEM TARTRATE 5 MG PO TABS: 5.0000 mg | ORAL_TABLET | Freq: Every evening | ORAL | Status: DC | PRN

## 2012-08-07 MED ORDER — OXYCODONE-ACETAMINOPHEN 5-325 MG PO TABS: 1.0000 | ORAL_TABLET | ORAL | Status: DC | PRN

## 2012-08-07 MED ORDER — LACTATED RINGERS IV SOLN
500.0000 mL | INTRAVENOUS | Status: DC | PRN
  Administered 2012-08-08 (×2): 500 mL via INTRAVENOUS

## 2012-08-07 MED ORDER — DOCUSATE SODIUM 100 MG PO CAPS
100.0000 mg | ORAL_CAPSULE | Freq: Every day | ORAL | Status: DC
  Administered 2012-08-08: 100 mg via ORAL
  Filled 2012-08-07: qty 1

## 2012-08-07 MED ORDER — ONDANSETRON HCL 4 MG/2ML IJ SOLN: 4.0000 mg | Freq: Four times a day (QID) | INTRAMUSCULAR | Status: DC | PRN

## 2012-08-07 MED ORDER — OXYTOCIN BOLUS FROM INFUSION
500.0000 mL | INTRAVENOUS | Status: DC
  Administered 2012-08-08: 500 mL via INTRAVENOUS

## 2012-08-07 NOTE — Progress Notes (Signed)
Subjective: Feeling more contractions now, but still talking through most of them.  Husband at bedside.  Objective: BP 127/74  Pulse 78  Temp(Src) 97.9 F (36.6 C) (Oral)  Resp 20  Ht 5\' 10"  (1.778 m)  Wt 195 lb (88.451 kg)  BMI 27.98 kg/m2  SpO2 100%  LMP 11/25/2011      Filed Vitals:   08/07/12 1927 08/07/12 1936 08/07/12 2058 08/07/12 2212  BP: 139/90 139/90 114/74 127/74  Pulse: 85 79 81 78  Temp:   97.9 F (36.6 C)   TempSrc:   Oral   Resp: 18  20 20   Height:   5\' 10"  (1.778 m)   Weight:   195 lb (88.451 kg)   SpO2: 100%         FHT:  Category 1 UC:   regular, every 4-5 minutes, mild/moderate SVE:   2 cm, 70%, vtx, 0 station (no change from earlier exam) Small amount dark blood on pad--no active bleeding at present.  [place]: Number of Fetuses: 1  Heart Rate: 123bpm  Movement: Present  Presentation: Cephalic  Placental Location: Posterior - no definite abnormality but the  placenta is not well visualized in its entirety.  Previa: No  Amniotic Fluid (Subjective): Decreased  Vertical pocket: 3.23cm AFI: 6.7cm (5%ile 7.7cm, 95%ile  24.9cm)  BPD: 8.7cm 35w 2d  A nuchal cord is identified (possibly x 2).  MATERNAL FINDINGS:  Cervix: Not evaluated  Uterus/Adnexae: No abnormalities identified  IMPRESSION:  Single living intrauterine gestation in cephalic presentation with  assigned gestational age of [redacted] weeks 5 days.  Nuchal cord - possibly x 2.  Oligohydramnios, with AFI 6.7   Results for orders placed during the hospital encounter of 08/07/12 (from the past 24 hour(s))  CBC     Status: Abnormal   Collection Time    08/07/12  7:30 PM      Result Value Range   WBC 18.8 (*) 4.0 - 10.5 K/uL   RBC 4.52  3.87 - 5.11 MIL/uL   Hemoglobin 14.0  12.0 - 15.0 g/dL   HCT 16.1  09.6 - 04.5 %   MCV 86.9  78.0 - 100.0 fL   MCH 31.0  26.0 - 34.0 pg   MCHC 35.6  30.0 - 36.0 g/dL   RDW 40.9  81.1 - 91.4 %   Platelets 219  150 - 400 K/uL  COMPREHENSIVE METABOLIC  PANEL     Status: Abnormal   Collection Time    08/07/12  7:30 PM      Result Value Range   Sodium 134 (*) 135 - 145 mEq/L   Potassium 3.7  3.5 - 5.1 mEq/L   Chloride 100  96 - 112 mEq/L   CO2 21  19 - 32 mEq/L   Glucose, Bld 97  70 - 99 mg/dL   BUN 11  6 - 23 mg/dL   Creatinine, Ser 7.82  0.50 - 1.10 mg/dL   Calcium 9.8  8.4 - 95.6 mg/dL   Total Protein 6.2  6.0 - 8.3 g/dL   Albumin 2.8 (*) 3.5 - 5.2 g/dL   AST 18  0 - 37 U/L   ALT 20  0 - 35 U/L   Alkaline Phosphatase 148 (*) 39 - 117 U/L   Total Bilirubin 0.6  0.3 - 1.2 mg/dL   GFR calc non Af Amer >90  >90 mL/min   GFR calc Af Amer >90  >90 mL/min  LACTATE DEHYDROGENASE     Status: None  Collection Time    08/07/12  7:30 PM      Result Value Range   LDH 174  94 - 250 U/L  URIC ACID     Status: None   Collection Time    08/07/12  7:30 PM      Result Value Range   Uric Acid, Serum 4.6  2.4 - 7.0 mg/dL  TYPE AND SCREEN     Status: None   Collection Time    08/07/12  7:31 PM      Result Value Range   ABO/RH(D) O POS     Antibody Screen NEG     Sample Expiration 08/10/2012    URINALYSIS, ROUTINE W REFLEX MICROSCOPIC     Status: Abnormal   Collection Time    08/07/12  7:55 PM      Result Value Range   Color, Urine YELLOW  YELLOW   APPearance CLEAR  CLEAR   Specific Gravity, Urine >1.030 (*) 1.005 - 1.030   pH 6.0  5.0 - 8.0   Glucose, UA NEGATIVE  NEGATIVE mg/dL   Hgb urine dipstick TRACE (*) NEGATIVE   Bilirubin Urine NEGATIVE  NEGATIVE   Ketones, ur 15 (*) NEGATIVE mg/dL   Protein, ur NEGATIVE  NEGATIVE mg/dL   Urobilinogen, UA 0.2  0.0 - 1.0 mg/dL   Nitrite NEGATIVE  NEGATIVE   Leukocytes, UA NEGATIVE  NEGATIVE  URINE MICROSCOPIC-ADD ON     Status: None   Collection Time    08/07/12  7:55 PM      Result Value Range   Squamous Epithelial / LPF RARE  RARE   WBC, UA 0-2  <3 WBC/hpf   RBC / HPF 0-2  <3 RBC/hpf   Bacteria, UA RARE  RARE  BP has improved since admission, normal PIH labs and negative urine  protein by cath  Assessment / Plan: IUP at 36 4/7 weeks 3rd trimester bleeding--stable ? Early labor Oligohydramnios Reactive FHR  Plan: Will continue to observe for onset of labor Continuous EFM Re-evaluate prn. Will allow clear liquids  Brenda Samano 08/07/2012, 10:37 PM

## 2012-08-07 NOTE — MAU Provider Note (Signed)
History     CSN: [id number]  Arrival date and time: 08/07/12 1902   None     Chief Complaint  Patient presents with  . Vaginal Bleeding   Vaginal Bleeding   31yo P0 at 36 4/7wks who called at about 6:30 or 6:45 reporting vaginal bleeding just now and upon arrival to MAU, the RN noted a saturated pad.  She does not recognize obvious contractions.  She does feel FM.  OB History   Grav Para Term Preterm Abortions TAB SAB Ect Mult Living   1               Past Medical History  Diagnosis Date  . Infection CHILDHOOD    UTI  . Shortness of breath   . Asthma AGE 61    INHALER PRN  . Abnormal Pap smear 2009    COLPO; LEEP;  NL SINCE LAST PAP 04/2011  . GERD (gastroesophageal reflux disease)     Past Surgical History  Procedure Laterality Date  . Wisdom tooth extraction  AS TEEN  . Leep    . Colposcopy      Family History  Problem Relation Age of Onset  . Early death Brother 88    ACCIDENT  . Rheum arthritis Maternal Aunt   . Arthritis Maternal Grandmother   . Hyperlipidemia Maternal Grandmother   . Heart disease Maternal Grandmother   . Arthritis Maternal Grandfather   . Alcohol abuse Maternal Grandfather   . Arthritis Paternal Grandmother   . Arthritis Paternal Grandfather   . Alcohol abuse Paternal Grandfather   . Hyperlipidemia Paternal Grandfather   . Heart disease Paternal Grandfather   . Fibromyalgia Maternal Aunt     History  Substance Use Topics  . Smoking status: Never Smoker   . Smokeless tobacco: Never Used  . Alcohol Use: No     Comment: BEER/WINE  D/'C'D WITH +UPT    Allergies:  Allergies  Allergen Reactions  . Other     SEASONAL AND PET DANDER - ASTHMA ; HIVES, ITCHING, WATERY EYES    Prescriptions prior to admission  Medication Sig Dispense Refill  . albuterol (PROVENTIL HFA;VENTOLIN HFA) 108 (90 BASE) MCG/ACT inhaler Inhale 2 puffs into the lungs every 4 (four) hours as needed.  1 Inhaler  6  . cetirizine (ZYRTEC) 10 MG tablet Take  10 mg by mouth daily.      . montelukast (SINGULAIR) 10 MG tablet Take 1 tablet (10 mg total) by mouth at bedtime.  30 tablet  2  . Prenatal Vit-Fe Fumarate-FA (PRENATAL 19 PO) Take 1 tablet by mouth. OTC        Review of Systems  Genitourinary: Positive for vaginal bleeding.   Physical Exam   Blood pressure 139/90, pulse 79, resp. rate 18, last menstrual period 11/25/2011, SpO2 100.00%.  Physical Exam 148/92 97.1 81 O2sat 100% Lungs CTA bil CV RRR ABD gravid Ext no calf tenderness Spec moderate clot and blood in vaginal vault, cervix watched and just trickling noted VE 2/75/-1, vtx FHT 130s-140s, + accels, no decels, moderate variability Toco +irritability and occas obvious ctx  MAU Course  Procedures  U/s to eval for abruption.  (prior ultrasounds no sign of previa and placenta posterior)  Assessment and Plan  P0 at 36 4/7 wks with 3rd trimester bleeding.  Pt is possibly in labor.  Will continue to evaluate.  Check CBC, CMET, Uric Acid, LDH and U/S. UA (via straight cath secondary to bleeding)  Fetal status is overall reassuring.  Will admit for observation.  Nigel Bridgeman 08/07/2012, 8:33 PM

## 2012-08-07 NOTE — Telephone Encounter (Signed)
Pt called reporting bleeding and no contractions.  She is 36wks.  I instructed her to come to MAU now.

## 2012-08-07 NOTE — MAU Note (Signed)
Pt brought back from registration desk to rm 5 -pt sttes she iis bleeding more than a period-Dr Su Hilt on unit and in to see pt

## 2012-08-07 NOTE — H&P (Signed)
CSN: [id number]  Arrival date and time: 08/07/12 1902   None     Chief Complaint  Patient presents with  . Vaginal Bleeding   Vaginal Bleeding   32yo P0 at 36 4/7wks who called at about 6:30 or 6:45 reporting vaginal bleeding just now and upon arrival to MAU, the RN noted a saturated pad.  She does not recognize obvious contractions.  She does feel FM.  OB History   Grav Para Term Preterm Abortions TAB SAB Ect Mult Living   1               Past Medical History  Diagnosis Date  . Infection CHILDHOOD    UTI  . Shortness of breath   . Asthma AGE 32    INHALER PRN  . Abnormal Pap smear 2009    COLPO; LEEP;  NL SINCE LAST PAP 04/2011  . GERD (gastroesophageal reflux disease)     Past Surgical History  Procedure Laterality Date  . Wisdom tooth extraction  AS TEEN  . Leep    . Colposcopy      Family History  Problem Relation Age of Onset  . Early death Brother 47    ACCIDENT  . Rheum arthritis Maternal Aunt   . Arthritis Maternal Grandmother   . Hyperlipidemia Maternal Grandmother   . Heart disease Maternal Grandmother   . Arthritis Maternal Grandfather   . Alcohol abuse Maternal Grandfather   . Arthritis Paternal Grandmother   . Arthritis Paternal Grandfather   . Alcohol abuse Paternal Grandfather   . Hyperlipidemia Paternal Grandfather   . Heart disease Paternal Grandfather   . Fibromyalgia Maternal Aunt     History  Substance Use Topics  . Smoking status: Never Smoker   . Smokeless tobacco: Never Used  . Alcohol Use: No     Comment: BEER/WINE  D/'C'D WITH +UPT    Allergies:  Allergies  Allergen Reactions  . Other     SEASONAL AND PET DANDER - ASTHMA ; HIVES, ITCHING, WATERY EYES    Prescriptions prior to admission  Medication Sig Dispense Refill  . albuterol (PROVENTIL HFA;VENTOLIN HFA) 108 (90 BASE) MCG/ACT inhaler Inhale 2 puffs into the lungs every 4 (four) hours as needed.  1 Inhaler  6  . cetirizine (ZYRTEC) 10 MG tablet Take 10 mg by mouth  daily.      . montelukast (SINGULAIR) 10 MG tablet Take 1 tablet (10 mg total) by mouth at bedtime.  30 tablet  2  . Prenatal Vit-Fe Fumarate-FA (PRENATAL 19 PO) Take 1 tablet by mouth. OTC        Prenatal Transfer Tool  Maternal Diabetes: No Genetic Screening: Normal Maternal Ultrasounds/Referrals: Abnormal:  Findings:   Other:AFI 6 Fetal Ultrasounds or other Referrals:  Other: CAN x 2 per [place] Maternal Substance Abuse:  No Significant Maternal Medications:  None Significant Maternal Lab Results: Lab values include: Group B Strep negative    Prenatal labs: ABO, Rh: O/Positive/-- (08/05 0000) Antibody: Negative (08/05 0000) Rubella:   Immune RPR: NON REAC (12/12 0904)  HBsAg: Negative (08/05 0000)  HIV: Non-reactive (08/05 0000)  GBS: NEGATIVE (02/18 0849) Sickle cell/Hgb electrophoresis:  NA Pap:  WNL 12/2011 GC:  Negative 12/2011 Chlamydia:  Negative 12/2011 Genetic screenings:  WNL Glucola:  WNL    Review of Systems  Genitourinary: Positive for vaginal bleeding.   Physical Exam   Blood pressure 139/90, pulse 79, resp. rate 18, last menstrual period 11/25/2011, SpO2 100.00%.  Physical Exam 148/92 97.1 81  O2sat 100% Lungs CTA bil CV RRR ABD gravid Ext no calf tenderness Spec moderate clot and blood in vaginal vault, cervix watched and just trickling noted VE 2/75/-1, vtx FHT 130s-140s, + accels, no decels, moderate variability Toco +irritability and occas obvious ctx  MAU Course  Procedures  U/s to eval for abruption.  (prior ultrasounds no sign of previa and placenta posterior)  Assessment and Plan  P0 at 36 4/7 wks with 3rd trimester bleeding.  Pt is possibly in labor.  Will continue to evaluate.  Check CBC, CMET, Uric Acid, LDH and U/S. UA (via straight cath secondary to bleeding)  Fetal status is overall reassuring.  Will admit for observation.  Nigel Bridgeman, CNM (copied note from Osborn Coho, MD) 08/07/2012, 8:33 PM

## 2012-08-07 NOTE — MAU Provider Note (Addendum)
  History     CSN: [id number]  Arrival date and time: 08/07/12 1902   None     Chief Complaint  Patient presents with  . Vaginal Bleeding   HPI 32yo P0 at 36 4/7wks who called at about 6:30 or 6:45 reporting vaginal bleeding just now and upon arrival to MAU, the RN noted a saturated pad.  She does not recognize obvious contractions.  She does feel FM.  OB History   Grav Para Term Preterm Abortions TAB SAB Ect Mult Living   1               Past Medical History  Diagnosis Date  . Infection CHILDHOOD    UTI  . Shortness of breath   . Asthma AGE 20    INHALER PRN  . Abnormal Pap smear 2009    COLPO; LEEP;  NL SINCE LAST PAP 04/2011    Past Surgical History  Procedure Laterality Date  . Wisdom tooth extraction  AS TEEN    Family History  Problem Relation Age of Onset  . Early death Brother 24    ACCIDENT  . Rheum arthritis Maternal Aunt   . Arthritis Maternal Grandmother   . Hyperlipidemia Maternal Grandmother   . Heart disease Maternal Grandmother   . Arthritis Maternal Grandfather   . Alcohol abuse Maternal Grandfather   . Arthritis Paternal Grandmother   . Arthritis Paternal Grandfather   . Alcohol abuse Paternal Grandfather   . Hyperlipidemia Paternal Grandfather   . Heart disease Paternal Grandfather   . Fibromyalgia Maternal Aunt     History  Substance Use Topics  . Smoking status: Never Smoker   . Smokeless tobacco: Never Used  . Alcohol Use: No     Comment: BEER/WINE  D/'C'D WITH +UPT    Allergies:  Allergies  Allergen Reactions  . Other     SEASONAL AND PET DANDER - ASTHMA ; HIVES, ITCHING, WATERY EYES    Prescriptions prior to admission  Medication Sig Dispense Refill  . albuterol (PROVENTIL HFA;VENTOLIN HFA) 108 (90 BASE) MCG/ACT inhaler Inhale 2 puffs into the lungs every 4 (four) hours as needed.  1 Inhaler  6  . cetirizine (ZYRTEC) 10 MG tablet Take 10 mg by mouth daily.      . montelukast (SINGULAIR) 10 MG tablet Take 1 tablet (10 mg  total) by mouth at bedtime.  30 tablet  2  . Prenatal Vit-Fe Fumarate-FA (PRENATAL 19 PO) Take 1 tablet by mouth. OTC        ROS Physical Exam   Last menstrual period 11/25/2011.  Physical Exam 148/92 97.1 81 O2sat 100% Lungs CTA bil CV RRR ABD gravid Ext no calf tenderness Spec moderate clot and blood in vaginal vault, cervix watched and just trickling noted VE 2/75/-1, vtx FHT 130s-140s, + accels, no decels, moderate variability Toco +irritability and occas obvious ctx  MAU Course  Procedures  U/s to eval for abruption.  (prior ultrasounds no sign of previa and placenta posterior)  Assessment and Plan  P0 at 36 4/7 wks with 3rd trimester bleeding.  Pt is possibly in labor.  Will continue to evaluate.  Check CBC, CMET, Uric Acid, LDH and U/S. UA (via straight cath secondary to bleeding)  Fetal status is overall reassuring.  Will admit for observation.  Purcell Nails 08/07/2012, 7:24 PM

## 2012-08-08 ENCOUNTER — Observation Stay (HOSPITAL_COMMUNITY): Payer: BC Managed Care – PPO | Admitting: Anesthesiology

## 2012-08-08 ENCOUNTER — Encounter (HOSPITAL_COMMUNITY): Payer: Self-pay | Admitting: *Deleted

## 2012-08-08 ENCOUNTER — Encounter (HOSPITAL_COMMUNITY): Payer: Self-pay | Admitting: Anesthesiology

## 2012-08-08 DIAGNOSIS — O4590 Premature separation of placenta, unspecified, unspecified trimester: Secondary | ICD-10-CM | POA: Diagnosis present

## 2012-08-08 LAB — CBC WITH DIFFERENTIAL/PLATELET
Basophils Relative: 0 % (ref 0–1)
Eosinophils Absolute: 0.2 10*3/uL (ref 0.0–0.7)
Eosinophils Relative: 1 % (ref 0–5)
HCT: 34 % — ABNORMAL LOW (ref 36.0–46.0)
Hemoglobin: 12.1 g/dL (ref 12.0–15.0)
Lymphs Abs: 3.3 10*3/uL (ref 0.7–4.0)
MCH: 30.9 pg (ref 26.0–34.0)
MCHC: 35.6 g/dL (ref 30.0–36.0)
MCV: 87 fL (ref 78.0–100.0)
Monocytes Absolute: 1.1 10*3/uL — ABNORMAL HIGH (ref 0.1–1.0)
Monocytes Relative: 6 % (ref 3–12)
RBC: 3.91 MIL/uL (ref 3.87–5.11)

## 2012-08-08 LAB — RPR: RPR Ser Ql: NONREACTIVE

## 2012-08-08 MED ORDER — TERBUTALINE SULFATE 1 MG/ML IJ SOLN: 0.2500 mg | Freq: Once | INTRAMUSCULAR | Status: DC | PRN

## 2012-08-08 MED ORDER — ONDANSETRON HCL 4 MG/2ML IJ SOLN: 4.0000 mg | INTRAMUSCULAR | Status: DC | PRN

## 2012-08-08 MED ORDER — LANOLIN HYDROUS EX OINT
TOPICAL_OINTMENT | CUTANEOUS | Status: DC | PRN
Start: 2012-08-08 — End: 2012-08-10

## 2012-08-08 MED ORDER — IBUPROFEN 600 MG PO TABS
600.0000 mg | ORAL_TABLET | Freq: Four times a day (QID) | ORAL | Status: DC
  Administered 2012-08-08 – 2012-08-10 (×6): 600 mg via ORAL
  Filled 2012-08-08 (×6): qty 1

## 2012-08-08 MED ORDER — LORATADINE 10 MG PO TABS
10.0000 mg | ORAL_TABLET | Freq: Every day | ORAL | Status: DC
  Filled 2012-08-08 (×4): qty 1

## 2012-08-08 MED ORDER — PHENYLEPHRINE 40 MCG/ML (10ML) SYRINGE FOR IV PUSH (FOR BLOOD PRESSURE SUPPORT)
80.0000 ug | PREFILLED_SYRINGE | INTRAVENOUS | Status: DC | PRN
  Filled 2012-08-08: qty 5

## 2012-08-08 MED ORDER — METHYLERGONOVINE MALEATE 0.2 MG PO TABS
ORAL_TABLET | ORAL | Status: AC
  Administered 2012-08-08: 0.2 mg via ORAL
  Filled 2012-08-08: qty 1

## 2012-08-08 MED ORDER — PRENATAL MULTIVITAMIN CH
1.0000 | ORAL_TABLET | Freq: Every day | ORAL | Status: DC
  Administered 2012-08-08 – 2012-08-10 (×3): 1 via ORAL
  Filled 2012-08-08 (×3): qty 1

## 2012-08-08 MED ORDER — BENZOCAINE-MENTHOL 20-0.5 % EX AERO
1.0000 "application " | INHALATION_SPRAY | CUTANEOUS | Status: DC | PRN
  Administered 2012-08-08: 1 via TOPICAL
  Filled 2012-08-08 (×2): qty 56

## 2012-08-08 MED ORDER — ONDANSETRON HCL 4 MG PO TABS: 4.0000 mg | ORAL_TABLET | ORAL | Status: DC | PRN

## 2012-08-08 MED ORDER — SODIUM BICARBONATE 8.4 % IV SOLN
INTRAVENOUS | Status: DC | PRN
  Administered 2012-08-08: 5 mL via EPIDURAL

## 2012-08-08 MED ORDER — METHYLERGONOVINE MALEATE 0.2 MG/ML IJ SOLN
0.2000 mg | Freq: Four times a day (QID) | INTRAMUSCULAR | Status: DC
  Filled 2012-08-08: qty 1

## 2012-08-08 MED ORDER — PHENYLEPHRINE 40 MCG/ML (10ML) SYRINGE FOR IV PUSH (FOR BLOOD PRESSURE SUPPORT): 80.0000 ug | PREFILLED_SYRINGE | INTRAVENOUS | Status: DC | PRN

## 2012-08-08 MED ORDER — WITCH HAZEL-GLYCERIN EX PADS: 1.0000 "application " | MEDICATED_PAD | CUTANEOUS | Status: DC | PRN

## 2012-08-08 MED ORDER — DIPHENHYDRAMINE HCL 25 MG PO CAPS: 25.0000 mg | ORAL_CAPSULE | Freq: Four times a day (QID) | ORAL | Status: DC | PRN

## 2012-08-08 MED ORDER — LACTATED RINGERS IV SOLN
500.0000 mL | Freq: Once | INTRAVENOUS | Status: AC
  Administered 2012-08-08: 500 mL via INTRAVENOUS

## 2012-08-08 MED ORDER — ALBUTEROL SULFATE HFA 108 (90 BASE) MCG/ACT IN AERS: 2.0000 | INHALATION_SPRAY | RESPIRATORY_TRACT | Status: DC | PRN

## 2012-08-08 MED ORDER — OXYTOCIN 40 UNITS IN LACTATED RINGERS INFUSION - SIMPLE MED
1.0000 m[IU]/min | INTRAVENOUS | Status: DC
  Administered 2012-08-08: 1 m[IU]/min via INTRAVENOUS
  Filled 2012-08-08: qty 1000

## 2012-08-08 MED ORDER — DIPHENHYDRAMINE HCL 50 MG/ML IJ SOLN
12.5000 mg | INTRAMUSCULAR | Status: DC | PRN
Start: 2012-08-08 — End: 2012-08-08

## 2012-08-08 MED ORDER — MEASLES, MUMPS & RUBELLA VAC ~~LOC~~ INJ
0.5000 mL | INJECTION | Freq: Once | SUBCUTANEOUS | Status: DC
  Filled 2012-08-08: qty 0.5

## 2012-08-08 MED ORDER — FENTANYL 2.5 MCG/ML BUPIVACAINE 1/10 % EPIDURAL INFUSION (WH - ANES)
14.0000 mL/h | INTRAMUSCULAR | Status: DC
  Administered 2012-08-08 (×2): 14 mL/h via EPIDURAL
  Filled 2012-08-08 (×2): qty 125

## 2012-08-08 MED ORDER — OXYCODONE-ACETAMINOPHEN 5-325 MG PO TABS
1.0000 | ORAL_TABLET | ORAL | Status: DC | PRN
  Administered 2012-08-08 – 2012-08-09 (×3): 1 via ORAL
  Filled 2012-08-08 (×4): qty 1

## 2012-08-08 MED ORDER — DIBUCAINE 1 % RE OINT
1.0000 "application " | TOPICAL_OINTMENT | RECTAL | Status: DC | PRN
  Filled 2012-08-08: qty 28

## 2012-08-08 MED ORDER — MONTELUKAST SODIUM 10 MG PO TABS
10.0000 mg | ORAL_TABLET | Freq: Every day | ORAL | Status: DC
  Administered 2012-08-08 – 2012-08-09 (×2): 10 mg via ORAL
  Filled 2012-08-08 (×3): qty 1

## 2012-08-08 MED ORDER — METHYLERGONOVINE MALEATE 0.2 MG PO TABS
0.2000 mg | ORAL_TABLET | Freq: Four times a day (QID) | ORAL | Status: DC
  Administered 2012-08-08 – 2012-08-09 (×3): 0.2 mg via ORAL
  Filled 2012-08-08 (×3): qty 1

## 2012-08-08 MED ORDER — TETANUS-DIPHTH-ACELL PERTUSSIS 5-2.5-18.5 LF-MCG/0.5 IM SUSP
0.5000 mL | Freq: Once | INTRAMUSCULAR | Status: DC
  Filled 2012-08-08: qty 0.5

## 2012-08-08 MED ORDER — EPHEDRINE 5 MG/ML INJ: 10.0000 mg | INTRAVENOUS | Status: DC | PRN

## 2012-08-08 MED ORDER — ZOLPIDEM TARTRATE 5 MG PO TABS: 5.0000 mg | ORAL_TABLET | Freq: Every evening | ORAL | Status: DC | PRN

## 2012-08-08 MED ORDER — EPHEDRINE 5 MG/ML INJ
10.0000 mg | INTRAVENOUS | Status: DC | PRN
  Filled 2012-08-08: qty 4

## 2012-08-08 MED ORDER — SIMETHICONE 80 MG PO CHEW: 80.0000 mg | CHEWABLE_TABLET | ORAL | Status: DC | PRN

## 2012-08-08 MED ORDER — SENNOSIDES-DOCUSATE SODIUM 8.6-50 MG PO TABS
2.0000 | ORAL_TABLET | Freq: Every day | ORAL | Status: DC
  Administered 2012-08-08 – 2012-08-09 (×2): 2 via ORAL

## 2012-08-08 NOTE — Progress Notes (Signed)
Najai Waszak is a 32 y.o. G1P0 at [redacted]w[redacted]d by LMP admitted for active labor, and heavy vaginal bleeding with possible abruption  Subjective: Pt is comfortable with epidural.  She has no complanints  Objective: BP 113/77  Pulse 87  Temp(Src) 98.4 F (36.9 C) (Oral)  Resp 18  Ht 5\' 10"  (1.778 m)  Wt 195 lb (88.451 kg)  BMI 27.98 kg/m2  SpO2 99%  LMP 11/25/2011 I/O last 3 completed shifts: In: -  Out: 600 [Urine:600]    FHT:  FHR: 130 bpm, variability: moderate,  accelerations:  Present,  decelerations:  Absent UC:   regular, every 1-2 minutes SVE:   Dilation: Lip/rim Effacement (%): 100 Station: 0;+1 Exam by:: Isac Sarna, RN  Labs: Lab Results  Component Value Date   WBC 18.5* 08/08/2012   HGB 12.1 08/08/2012   HCT 34.0* 08/08/2012   MCV 87.0 08/08/2012   PLT 188 08/08/2012    Assessment / Plan: Augmentation of labor, progressing well  Labor: progressing with a good contraction pattern.  will leave the pitocin at 3mu  Preeclampsia:  no signs or symptoms of toxicity Fetal Wellbeing:  Category I Pain Control:  Epidural I/D:  n/a Anticipated MOD:  NSVD.  Large clot about the size of my palm seen during exam. It was dark and c/w old blood.  The NST is reassuring and the pt  Is stable.  Will monitor closely  Sarra Rachels A 08/08/2012, 11:36 AM

## 2012-08-08 NOTE — Progress Notes (Signed)
Patient ID: Krystal Stanley, female   DOB: [birth year], 32 y.o.   MRN: [id number] Delivery Note At 1:09 PM a viable female was delivered via Vaginal, Spontaneous Delivery (Presentation: ; Occiput Anterior).  APGAR: 9, 9; weight 5 lb 10.8 oz (2574 g).   Placenta status: Intact, Spontaneous. Large clots seen behind the placenta c/w abruption.    Cord: 3 vessels with the following complications: None.  Cord pH: 7.4  Anesthesia: Epidural  Episiotomy: None Lacerations: 2nd degree;Perineal Suture Repair: 2.0 chromic Est. Blood Loss (mL):  400 cc.  Pt still with some heavy bleeding after delivery.  It slowed with fundal message.  Will give methergine for 24 hours.    Mom to postpartum.  Baby to nursery-stable.  [name] A 08/08/2012, 1:43 PM

## 2012-08-08 NOTE — Progress Notes (Signed)
  Subjective: Received IV pain medication with minimal benefit.  Requests VE to assess progress, to determine decision for pain medication.  Passed moderate clot.  UCs stronger, patient breathing through UCs now.  Objective: BP 110/84  Pulse 88  Temp(Src) 97.9 F (36.6 C) (Oral)  Resp 20  Ht 5\' 10"  (1.778 m)  Wt 195 lb (88.451 kg)  BMI 27.98 kg/m2  SpO2 100%  LMP 11/25/2011      FHT: Category 1 UC:   regular, every 3 minutes SVE:   Dilation: 3.5 Effacement (%): 100 Station: 0 Exam by:: Emilee Hero CNM Cervix very thin.  Assessment / Plan: Progressive labor 3rd trimester bleeding Will place epidural per patient request Close observation of status.  Nigel Bridgeman 08/08/2012, 12:57 AM

## 2012-08-08 NOTE — Progress Notes (Signed)
  Subjective: Comfortable with epidural--has slept at intervals.  Objective: BP 86/52  Pulse 84  Temp(Src) 98.2 F (36.8 C) (Oral)  Resp 18  Ht 5\' 10"  (1.778 m)  Wt 195 lb (88.451 kg)  BMI 27.98 kg/m2  SpO2 99%  LMP 11/25/2011   Total I/O In: -  Out: 600 [Urine:600] Foley in place.  FHT:  2 late decels noted, decreased variability. UC:   regular, every 5 minutes SVE:   Dilation: 5.5 Effacement (%): 100 Station: -1 Exam by:: V Arlyce Circle CNM Membranes bulging--AROM, clear fluid, small amount. Small amount dark bleeding with exam. Repeat Hgb at 1:20am = 12.1 IUPC and FSE inserted.  Resolution of late decels with position change. UCs 2-4 min after AROM.  Assessment / Plan: Progressive labor Vaginal bleeding--stable Will observe status of labor after AROM--augment prn.  Nigel Bridgeman 08/08/2012, 6:07 AM

## 2012-08-08 NOTE — Progress Notes (Signed)
[place] CNM updated on pt comfort level and pt request for SVE.  Emilee Hero to report to pt bedside soon.

## 2012-08-08 NOTE — Progress Notes (Signed)
  Subjective: Felt faint while sitting up for epidural--asked to lie down due to feeling faint.  Anesthesia had not started epidural placement.  IV bolus was ongoing, patient had just gotten up to bathroom, but had no syncopal episode at that time. Placed patient in recumbent position, with resolution of faint feeling.  Objective: BP 84/49  Pulse 75  Temp(Src) 97.9 F (36.6 C) (Oral)  Resp 20  Ht 5\' 10"  (1.778 m)  Wt 195 lb (88.451 kg)  BMI 27.98 kg/m2  SpO2 100%  LMP 11/25/2011   Filed Vitals:   08/08/12 0001 08/08/12 0105 08/08/12 0108 08/08/12 0113  BP: 110/84  84/49   Pulse: 88 98 82 75  Temp:      TempSrc:      Resp: 20  20   Height:      Weight:      SpO2:       BP 118/70 now, after episode.     FHT: Reassuring, less variability since Stadol dose, no decels UC:   regular, every 3-4 minutes SVE:  Deferred Moderate bleeding noted, with small clots passed.  Assessment / Plan: Bleeding during labor Will recheck CBC now.  Krystal Stanley 08/08/2012, 1:14 AM

## 2012-08-08 NOTE — Progress Notes (Signed)
  Subjective: Comfortable with epidural.  Objective: BP 91/67  Pulse 97  Temp(Src) 97.5 F (36.4 C) (Oral)  Resp 18  Ht 5\' 10"  (1.778 m)  Wt 195 lb (88.451 kg)  BMI 27.98 kg/m2  SpO2 99%  LMP 11/25/2011 I/O last 3 completed shifts: In: -  Out: 600 [Urine:600]    FHT:  Category 1 UC:   regular, every 3-4 minutes SVE:   Dilation: 5.5 Effacement (%): 100 Station: -1 Exam by:: V Alethea Terhaar CNM MVUs still 120s  Assessment / Plan: Inadequate labor Will start augmentation per low-dose protocol.  Nigel Bridgeman 08/08/2012, 7:52 AM

## 2012-08-08 NOTE — Progress Notes (Signed)
Emilee Hero CNM updated on pt bleeding, FHR, UC pattern, and pt comfort level.  Orders given to continue to assess.

## 2012-08-08 NOTE — Progress Notes (Signed)
  Subjective: Sleeping at intervals.  Objective: BP 102/59  Pulse 87  Temp(Src) 98.2 F (36.8 C) (Oral)  Resp 18  Ht 5\' 10"  (1.778 m)  Wt 195 lb (88.451 kg)  BMI 27.98 kg/m2  SpO2 99%  LMP 11/25/2011   Total I/O In: -  Out: 600 [Urine:600]  FHT: Category 1 UC:   q 3-4 min VE deferred at present. MVUs 120  Assessment / Plan: Inadequate labor Will anticipate augmentation with pitocin. Dr. Su Hilt updated on status Report to Dr. Stefano Gaul.  Krystal Stanley 08/08/2012, 7:00 AM

## 2012-08-08 NOTE — Anesthesia Preprocedure Evaluation (Signed)
Anesthesia Evaluation  Patient identified by MRN, date of birth, ID band Patient awake    Reviewed: Allergy & Precautions, H&P , Patient's Chart, lab work & pertinent test results  Airway Mallampati: II  TM Distance: >3 FB Neck ROM: full    Dental  (+) Teeth Intact   Pulmonary asthma ,  breath sounds clear to auscultation        Cardiovascular Rhythm:regular Rate:Normal     Neuro/Psych    GI/Hepatic GERD-  ,  Endo/Other    Renal/GU      Musculoskeletal   Abdominal   Peds  Hematology   Anesthesia Other Findings       Reproductive/Obstetrics (+) Pregnancy                             Anesthesia Physical Anesthesia Plan  ASA: II  Anesthesia Plan: Epidural   Post-op Pain Management:    Induction:   Airway Management Planned:   Additional Equipment:   Intra-op Plan:   Post-operative Plan:   Informed Consent: I have reviewed the patients History and Physical, chart, labs and discussed the procedure including the risks, benefits and alternatives for the proposed anesthesia with the patient or authorized representative who has indicated his/her understanding and acceptance.   Dental Advisory Given  Plan Discussed with:   Anesthesia Plan Comments: (Labs checked- platelets confirmed with RN in room. Fetal heart tracing, per RN, reported to be stable enough for sitting procedure. Discussed epidural, and patient consents to the procedure:  included risk of possible headache,backache, failed block, allergic reaction, and nerve injury. This patient was asked if she had any questions or concerns before the procedure started.)        Anesthesia Quick Evaluation  

## 2012-08-08 NOTE — Progress Notes (Signed)
Krystal Stanley is a 32 y.o. G1P0 at [redacted]w[redacted]d.  Subjective:  Comfortable with her epidural.  Objective:  BP 99/68  Pulse 84  Temp(Src) 97.5 F (36.4 C) (Oral)  Resp 18  Ht 5\' 10"  (1.778 m)  Wt 195 lb (88.451 kg)  BMI 27.98 kg/m2  SpO2 99%  LMP 11/25/2011 I/O last 3 completed shifts: In: -  Out: 600 [Urine:600]    FHT:  Cat: 1 UC:   regular, every 3-4 minutes SVE:   Dilation: 5.5 Effacement (%): 100 Station: -1 Exam by:: Krystal Stanley, CNM   Labs:  Lab Results  Component Value Date   WBC 18.5* 08/08/2012   HGB 12.1 08/08/2012   HCT 34.0* 08/08/2012   MCV 87.0 08/08/2012   PLT 188 08/08/2012    Assessment / Plan:  Stable  Labor: Continue Pitocin augmentation Anticipated MOD:  NSVD  Krystal Stanley 08/08/2012, 8:30 AM

## 2012-08-08 NOTE — Anesthesia Procedure Notes (Signed)

## 2012-08-09 ENCOUNTER — Encounter: Payer: BC Managed Care – PPO | Admitting: Obstetrics and Gynecology

## 2012-08-09 LAB — CBC
HCT: 33.7 % — ABNORMAL LOW (ref 36.0–46.0)
MCH: 30.4 pg (ref 26.0–34.0)
MCV: 87.5 fL (ref 78.0–100.0)
Platelets: 184 10*3/uL (ref 150–400)
RDW: 13 % (ref 11.5–15.5)

## 2012-08-09 NOTE — Progress Notes (Signed)
Called [facility] in regards if SL can be d/c'd as well as methergine. Vaginal in WDL, fundus firm. Hgb. WDL. Orders received to d/c them.

## 2012-08-09 NOTE — Lactation Note (Addendum)
This note was copied from the chart of Girl Shellsea Borunda. Lactation Consultation Note Baby did not latch well at start of this consult; baby latched for a minute then was asleep and unable to wake baby for feeding. Mom states baby often latches for a minute then goes to sleep. Mom states baby has had 2 previous good feedings up to this point (baby now 52 hours old). Mom can easily hand express good amt colostrum, and continues to pump although the pump is not getting much.  Discussed options with mom and dad, encouraged mom to hand express directly into the bottle and feed baby with curved tip syringe. Instructed mom and dad that baby can be fed with syringe directly into the nipple shield or by finger feeding. Parents opt to finger feed.  Dad fed baby using curved tip syringe and finger, 5 mL expressed colostrum. Baby did get in to a good rhythm with a strong suck. When the syringe was empty, baby was still sucking. Mom then placed baby back at the breast, left side football, and baby latched well for another 25 minutes of rhythmic sucking; mom states comfortable; audible swallows; colostrum present in nipple shield when baby self detached.  Instructed mom and dad to continue frequent STS and cue based feeding, feed baby at least 8 times a day, to continue pumping every 3 hours regardless of whether or not baby latched, and to hand express. Instructed parents to continue offering supplement of expressed br milk, if baby does not latch well, using their method of choice, curved tip syringe in the nipple shield or finger feeding. Discussed pump options with parents, dad is going to call his insurance company to discuss coverage; parents informed of pump rentals from this hosp. Enc parents to call for assistance if needed.   Patient Name: Girl Teghan Philbin [id number] Date: 08/09/2012 Reason for consult: Follow-up assessment;Late preterm infant   Maternal Data Has patient been taught Hand Expression?: Yes Does  the patient have breastfeeding experience prior to this delivery?: No  Feeding Feeding Type: Breast Fed Feeding method: Breast Length of feed: 25 min  LATCH Score/Interventions Latch: Grasps breast easily, tongue down, lips flanged, rhythmical sucking. Intervention(s): Skin to skin;Teach feeding cues;Waking techniques Intervention(s): Adjust position;Assist with latch;Breast massage;Breast compression  Audible Swallowing: A few with stimulation Intervention(s): Skin to skin;Hand expression  Type of Nipple: Everted at rest and after stimulation Intervention(s): Double electric pump  Comfort (Breast/Nipple): Soft / non-tender     Hold (Positioning): Assistance needed to correctly position infant at breast and maintain latch. Intervention(s): Breastfeeding basics reviewed;Support Pillows;Position options;Skin to skin  LATCH Score: 8  Lactation Tools Discussed/Used Tools: Nipple Shields Pump Review: Setup, frequency, and cleaning;Milk Storage   Consult Status Consult Status: Follow-up Follow-up type: In-patient    Octavio Manns [facility] 08/09/2012, 3:41 PM

## 2012-08-09 NOTE — Progress Notes (Addendum)
Patient ID: Krystal Stanley, female   DOB: 03-31-1981, 32 y.o.   MRN: [id number] Post Partum Day 1  Subjective: no complaints, up ad lib without syncope, voiding, tolerating PO,  Pain well controlled with po meds,  BF initiated   Mood stable, bonding well Contraception: condoms    Objective: Blood pressure 132/78, pulse 20, temperature 97.9 F (36.6 C), temperature source Oral, resp. rate 19, height 5\' 10"  (1.778 m), weight 195 lb (88.451 kg), last menstrual period 11/25/2011, SpO2 94.00%, unknown if currently breastfeeding.  Physical Exam:  General: NAD  Lochia: appropriate Uterine Fundus: firm Perineum: DVT Evaluation: No evidence of DVT seen on physical exam.   Recent Labs  08/08/12 0120 08/09/12 0520  HGB 12.1 11.7*  HCT 34.0* 33.7*    Assessment/Plan: Plan for discharge tomorrow       LOS: 2 days   Krystal Stanley M 08/09/2012, 7:05 PM

## 2012-08-09 NOTE — Anesthesia Postprocedure Evaluation (Signed)
  Anesthesia Post-op Note  Patient: Krystal Stanley Anesthesia Post Note  Patient: Krystal Stanley  Procedure(s) Performed: * No procedures listed *  Anesthesia type: Epidural  Patient location: Mother/Baby  Post pain: Pain level controlled  Post assessment: Post-op Vital signs reviewed  Last Vitals:  Filed Vitals:   08/09/12 0545  BP: 117/80  Pulse: 85  Temp: 36.4 C  Resp: 18    Post vital signs: Reviewed  Level of consciousness:alert  Complications: No apparent anesthesia complications

## 2012-08-09 NOTE — Anesthesia Postprocedure Evaluation (Signed)
  Anesthesia Post-op Note  Patient: Krystal Stanley Anesthesia Post Note  Patient: [name]  Procedure(s) Performed: * No procedures listed *  Anesthesia type: Epidural  Patient location: Mother/Baby  Post pain: Pain level controlled  Post assessment: Post-op Vital signs reviewed  Last Vitals:  Filed Vitals:   08/09/12 0545  BP: 117/80  Pulse: 85  Temp: 36.4 C  Resp: 18    Post vital signs: Reviewed  Level of consciousness:alert  Complications: No apparent anesthesia complications  

## 2012-08-10 LAB — CBC WITH DIFFERENTIAL/PLATELET
Basophils Absolute: 0 10*3/uL (ref 0.0–0.1)
Basophils Relative: 0 % (ref 0–1)
Eosinophils Absolute: 0.4 10*3/uL (ref 0.0–0.7)
HCT: 31.1 % — ABNORMAL LOW (ref 36.0–46.0)
Hemoglobin: 10.6 g/dL — ABNORMAL LOW (ref 12.0–15.0)
MCH: 30.4 pg (ref 26.0–34.0)
MCHC: 34.1 g/dL (ref 30.0–36.0)
Monocytes Absolute: 0.9 10*3/uL (ref 0.1–1.0)
Monocytes Relative: 6 % (ref 3–12)
Neutro Abs: 10.3 10*3/uL — ABNORMAL HIGH (ref 1.7–7.7)
Neutrophils Relative %: 68 % (ref 43–77)
RDW: 13.2 % (ref 11.5–15.5)

## 2012-08-10 MED ORDER — IBUPROFEN 600 MG PO TABS: 600.0000 mg | ORAL_TABLET | Freq: Four times a day (QID) | ORAL | Status: DC | PRN

## 2012-08-10 NOTE — Discharge Summary (Signed)
Obstetric Discharge Summary Reason for Admission: observation/evaluation and Vaginal bleeding w/ suspicion for early labor and placenta abruption Prenatal Procedures: ultrasound Intrapartum Procedures: spontaneous vaginal delivery and epidural; Pitocin augmentation. Postpartum Procedures: none Complications-Operative and Postpartum: 2nd degree perineal laceration Hemoglobin  Date Value Range Status  08/10/2012 10.6* 12.0 - 15.0 g/dL Final  4/0/9811 91.4   Final     HCT  Date Value Range Status  08/10/2012 31.1* 36.0 - 46.0 % Final  01/17/2012 44   Final    Physical Exam:  General: alert, cooperative, appears stated age and no distress Lochia: appropriate Uterine Fundus: firm Incision: n/a DVT Evaluation: No evidence of DVT seen on physical exam. Negative Homan's sign. No significant calf/ankle edema.  Hospital Course: Pt presented on evening of 08/07/12 at [redacted]w[redacted]d w/ CC of heavy VB; cx=2/75/-1 and uterine irritability noted.  U/s obtained to assess placenta and no apparent abnormalities noted, but oligohydramnios noted w/ AFI=5%, and NCx1, possibly 2.  Pt admitted for observation with suspicion for early labor and possibly abruption, but pt also w/ h/o LEEP in past.  BP slightly elevated=139/90 and PIH labs WNL w/ exception of leukocytosis=18.8.  Cath u/a negative for protein.  As evening progressed, pt did indeed spontaneously labor, and BP did normalize w/ IV pain medication, and eventually epidural.  Around 0100, cx=3.5/100%.  Pt continued to have VB w/ clots intrapartum.  AROM at 0556 and cx=5.5/100%/-1 and internal monitors placed.  MVU's inadequate and morning of 08/08/12, labor augmented w/ pitocin.  Cx complete at 1237, and SVD at 1309.  Evaluation of placenta by Dr. Normand Sloop did reveal areas c/w abruption.  Cord pH sent and =7.4.  Dr. Normand Sloop ordered po Methergine x24 hrs secondary to EBL both at time of birth and intrapartum loss.   Pt's PP recovery has been uncomplicated.  Hgb stable.   Pt has been up ad lib.  Tol po.  Voiding w/o difficulty and VB much lighter.  No BM yet. Unsure r/e BC, but condoms for now.  Lactation initiated.  Grandparents coming (one set first week, next set 2nd week) for support w/ newborn.  Discharge Diagnoses: Antepartum bleeding, Premature labor and suspected abruption; lactating; h/o LEEP; s/p preterm labor and preterm delivery at [redacted]w[redacted]d; Oligohydramnios on u/s at time of admission.    Discharge Information: Date: 08/10/2012 PPD#2 Activity: pelvic rest Diet: routine Medications: PNV, Ibuprofen and albuterol, singular, prn Zyrtec Condition: stable Instructions: refer to practice specific booklet Discharge to: home Follow-up Information   Follow up with [facility]. Schedule an appointment as soon as possible for a visit in 6 weeks. (or call as needed with any questions or concerns)    Contact information:   [address] [place] [address] [phone]      Newborn Data: Live born female "Krystal Stanley" (delivery provider: Dr. Jaymes Graff, MD) Birth Weight: 5 lb 10.8 oz (2574 g) APGAR: 9, 9  Home with mother.  Krystal Stanley H 08/10/2012, 11:30 AM

## 2012-08-11 LAB — TYPE AND SCREEN
ABO/RH(D): O POS
Unit division: 0

## 2012-08-12 ENCOUNTER — Encounter (HOSPITAL_COMMUNITY)
Admission: RE | Admit: 2012-08-12 | Discharge: 2012-08-12 | Disposition: A | Discharge status: Home / Self Care | Payer: BC Managed Care – PPO | Source: Ambulatory Visit | Attending: Obstetrics and Gynecology | Admitting: Obstetrics and Gynecology

## 2012-08-15 ENCOUNTER — Encounter: Payer: BC Managed Care – PPO | Admitting: Obstetrics and Gynecology

## 2012-08-15 ENCOUNTER — Ambulatory Visit (HOSPITAL_COMMUNITY): Payer: BC Managed Care – PPO

## 2012-08-15 ENCOUNTER — Ambulatory Visit (HOSPITAL_COMMUNITY)
Admission: RE | Admit: 2012-08-15 | Discharge: 2012-08-15 | Disposition: A | Discharge status: Home / Self Care | Payer: BC Managed Care – PPO | Source: Ambulatory Visit | Attending: Obstetrics and Gynecology | Admitting: Obstetrics and Gynecology

## 2012-08-15 NOTE — Lactation Note (Signed)
Adult Lactation Consultation Outpatient Visit Note  Patient Name: Krystal Stanley Date of Birth: 17-Oct-1980 Gestational Age at Delivery: [redacted]w[redacted]d Type of Delivery:   Breastfeeding History: Frequency of Breastfeeding: q 3 during the day and q 2 1/2 at night. Wakes herself at night. Mom awakens during the day Length of Feeding: 45 minutes Voids: Lots Stools: Lots  [name] has been under bili blankets until yesterday. Mom has been using NS but has been able to get baby latched to breast without it- Last used it Sunday night.  Supplementing / Method: Pumping:  Type of Pump: Medela PIS   Frequency: 4-6 times/ day  Volume:  5 cc's- 1 oz  Comments:    Consultation Evaluation:  Initial Feeding Assessment: [name] weighed 5-2 at [facility] on Friday Pre-feed Weight: 5-8.2  2500g  Up 6 oz in 4 day-s Mom very pleased with weight gain.  Post-feed Weight: 5- 8.6  2512g Amount Transferred: 12 cc's Comments: [name] very sleepy. Mom reports that she sleeps a lot through the day but is awake and nurses better at night. Nursed for 15 minutes- needed stimulation the entire time to continue nursing. Several swallows noted. Reviewed normal preterm behavior.  Additional Feeding Assessment: Pre-feed Weight: 5- 8.6  2512g Post-feed Weight: 5- 9.1  2526   Amount Transferred: 14 cc's Comments: [name] continues sleepy. Latches easily but again needs stimulation to continue nursing. More swallows noted on this breast- (left) .Baby very relaxed and sleepy.     Total Breast milk Transferred this Visit: 26 cc's Total Supplement Given: 0  Additional Interventions: Encouraged mom to continue pumping to promote milk supply. Reviewed awakening techniques. Mom asking about feeding the first milk she pumped since it is yellowish and she wants [name] to get the Colostrum. Suggested she syringe feed that to [name] either while she is latched or after nursing. Suggested waiting to give bottles.   Follow-Up  To see Ped on Friday To  call here prn  Wants to see how things are going before making another appointment here.    Pamelia Hoit 08/15/2012, 3:22 PM

## 2012-08-21 ENCOUNTER — Encounter: Payer: Self-pay | Admitting: Obstetrics and Gynecology

## 2012-08-24 ENCOUNTER — Encounter (HOSPITAL_COMMUNITY): Payer: Self-pay

## 2012-08-24 ENCOUNTER — Encounter: Payer: BC Managed Care – PPO | Admitting: Obstetrics and Gynecology

## 2012-08-24 DIAGNOSIS — O4590 Premature separation of placenta, unspecified, unspecified trimester: Secondary | ICD-10-CM

## 2012-08-24 HISTORY — DX: Premature separation of placenta, unspecified, unspecified trimester: O45.90

## 2014-04-15 ENCOUNTER — Encounter (HOSPITAL_COMMUNITY): Payer: Self-pay

## 2015-06-15 NOTE — L&D Delivery Note (Addendum)
  This note was entered in error: This patient had an ectopic pregnancy in the first trimester in left tube that required laparoscopic salpingectomy.  She does not need a delivery note. Dr. Sallye OberKulwa.

## 2015-11-11 ENCOUNTER — Ambulatory Visit (HOSPITAL_COMMUNITY)
Admission: AD | Admit: 2015-11-11 | Discharge: 2015-11-11 | Disposition: A | Discharge status: Home / Self Care | Payer: BLUE CROSS/BLUE SHIELD | Source: Ambulatory Visit | Attending: Obstetrics & Gynecology | Admitting: Obstetrics & Gynecology

## 2015-11-11 ENCOUNTER — Encounter (HOSPITAL_COMMUNITY): Payer: Self-pay | Admitting: *Deleted

## 2015-11-11 ENCOUNTER — Inpatient Hospital Stay (HOSPITAL_COMMUNITY): Payer: BLUE CROSS/BLUE SHIELD | Admitting: Anesthesiology

## 2015-11-11 ENCOUNTER — Encounter (HOSPITAL_COMMUNITY)
Admission: AD | Disposition: A | Discharge status: Home / Self Care | Payer: Self-pay | Source: Ambulatory Visit | Attending: Obstetrics & Gynecology

## 2015-11-11 ENCOUNTER — Inpatient Hospital Stay (HOSPITAL_COMMUNITY): Payer: BLUE CROSS/BLUE SHIELD

## 2015-11-11 DIAGNOSIS — N838 Other noninflammatory disorders of ovary, fallopian tube and broad ligament: Secondary | ICD-10-CM | POA: Diagnosis not present

## 2015-11-11 DIAGNOSIS — O9989 Other specified diseases and conditions complicating pregnancy, childbirth and the puerperium: Secondary | ICD-10-CM | POA: Diagnosis not present

## 2015-11-11 DIAGNOSIS — R109 Unspecified abdominal pain: Secondary | ICD-10-CM

## 2015-11-11 DIAGNOSIS — J45909 Unspecified asthma, uncomplicated: Secondary | ICD-10-CM | POA: Diagnosis not present

## 2015-11-11 DIAGNOSIS — R1032 Left lower quadrant pain: Secondary | ICD-10-CM | POA: Diagnosis present

## 2015-11-11 DIAGNOSIS — O26899 Other specified pregnancy related conditions, unspecified trimester: Secondary | ICD-10-CM

## 2015-11-11 DIAGNOSIS — O001 Tubal pregnancy without intrauterine pregnancy: Secondary | ICD-10-CM | POA: Insufficient documentation

## 2015-11-11 HISTORY — PX: UNILATERAL SALPINGECTOMY: SHX6160

## 2015-11-11 HISTORY — PX: LAPAROSCOPY: SHX197

## 2015-11-11 LAB — CBC
HEMATOCRIT: 39.3 % (ref 36.0–46.0)
Hemoglobin: 13.8 g/dL (ref 12.0–15.0)
MCH: 30 pg (ref 26.0–34.0)
MCHC: 35.1 g/dL (ref 30.0–36.0)
MCV: 85.4 fL (ref 78.0–100.0)
Platelets: 233 10*3/uL (ref 150–400)
RBC: 4.6 MIL/uL (ref 3.87–5.11)
RDW: 12.2 % (ref 11.5–15.5)
WBC: 15.7 10*3/uL — AB (ref 4.0–10.5)

## 2015-11-11 LAB — URINALYSIS, ROUTINE W REFLEX MICROSCOPIC
Bilirubin Urine: NEGATIVE
Glucose, UA: NEGATIVE mg/dL
Hgb urine dipstick: NEGATIVE
Ketones, ur: NEGATIVE mg/dL
Leukocytes, UA: NEGATIVE
Nitrite: NEGATIVE
Protein, ur: NEGATIVE mg/dL
Specific Gravity, Urine: 1.015 (ref 1.005–1.030)
pH: 7 (ref 5.0–8.0)

## 2015-11-11 LAB — WET PREP, GENITAL
CLUE CELLS WET PREP: NONE SEEN
Sperm: NONE SEEN
Trich, Wet Prep: NONE SEEN
Yeast Wet Prep HPF POC: NONE SEEN

## 2015-11-11 LAB — POCT PREGNANCY, URINE: Preg Test, Ur: POSITIVE — AB

## 2015-11-11 LAB — TYPE AND SCREEN
ABO/RH(D): O POS
ANTIBODY SCREEN: NEGATIVE

## 2015-11-11 LAB — ABO/RH: ABO/RH(D): O POS

## 2015-11-11 LAB — HCG, QUANTITATIVE, PREGNANCY: hCG, Beta Chain, Quant, S: 2675 m[IU]/mL — ABNORMAL HIGH (ref <5)

## 2015-11-11 IMAGING — US US OB COMP LESS 14 WK
1 series · 15 of 28 positions shown · non-contrast
Comparison: None.

ADDENDUM:
These results were called by telephone at the time of interpretation
on [DATE] at [DATE] to Dr. MARGARETH , who verbally
acknowledged these results.
CLINICAL DATA: Left lower quadrant pain and vaginal bleeding.

EXAM:
OBSTETRIC <14 WK US AND TRANSVAGINAL OB US
TECHNIQUE: Both transabdominal and transvaginal ultrasound examinations were
performed for complete evaluation of the gestation as well as the
maternal uterus, adnexal regions, and pelvic cul-de-sac.
Transvaginal technique was performed to assess early pregnancy.

[Series 1: us ob comp less 14 wk · 15 of 59 slices shown]
[im 1/59]
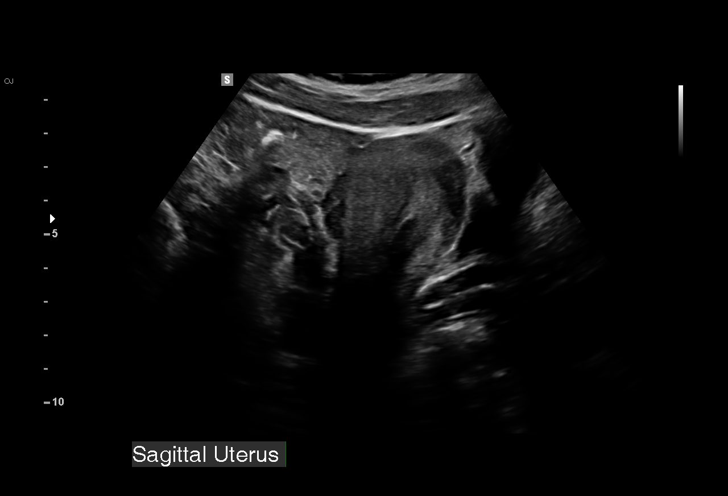
[im 5/59]
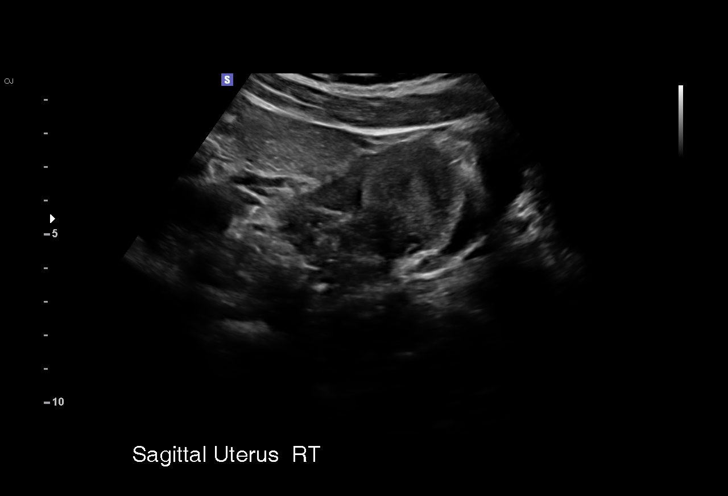
[im 9/59]
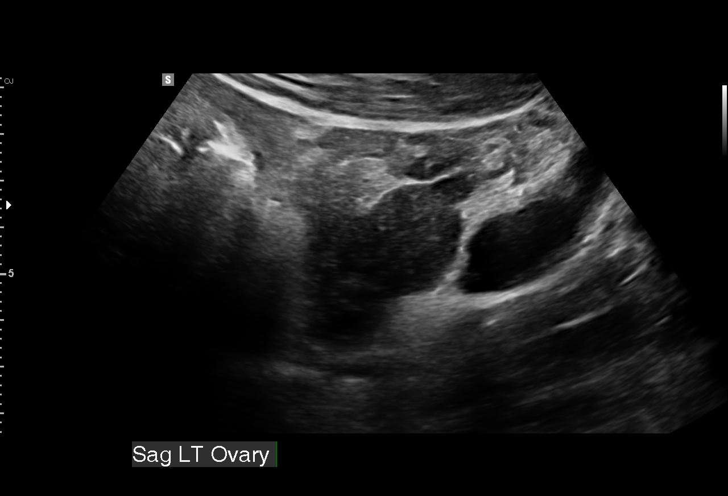
[im 13/59]
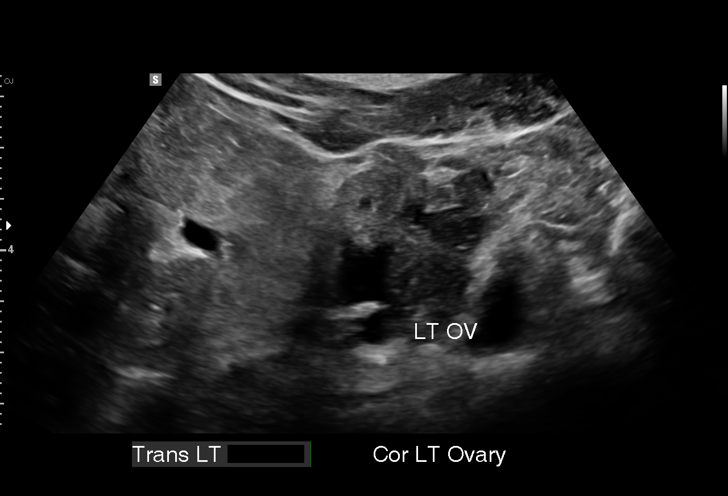
[im 18/59]
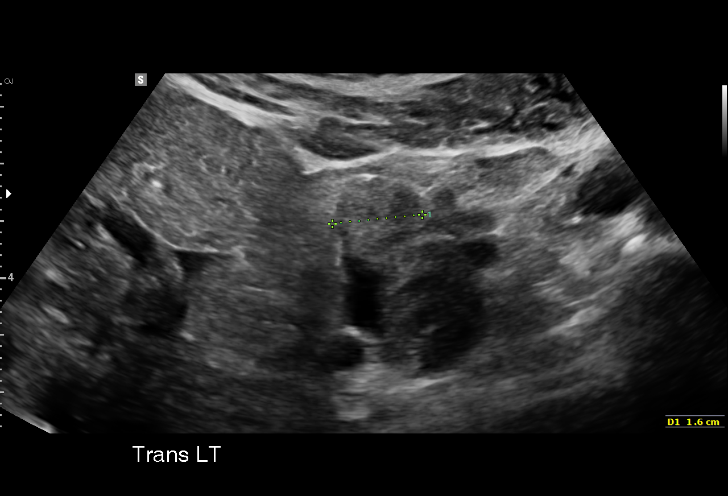
[im 22/59]
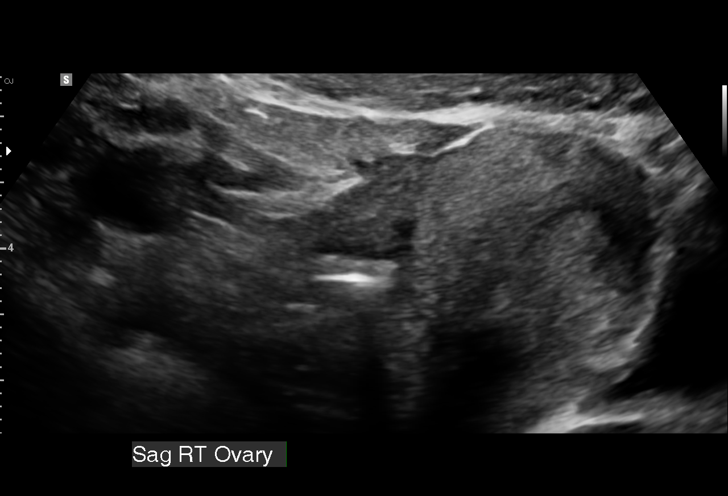
[im 26/59]
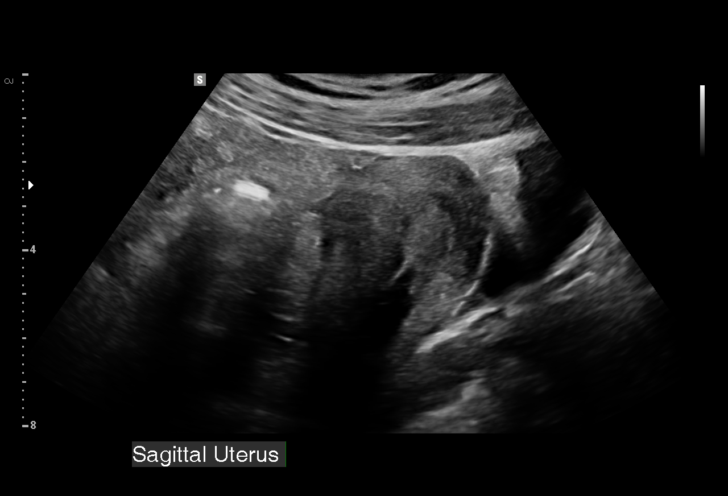
[im 31/59]
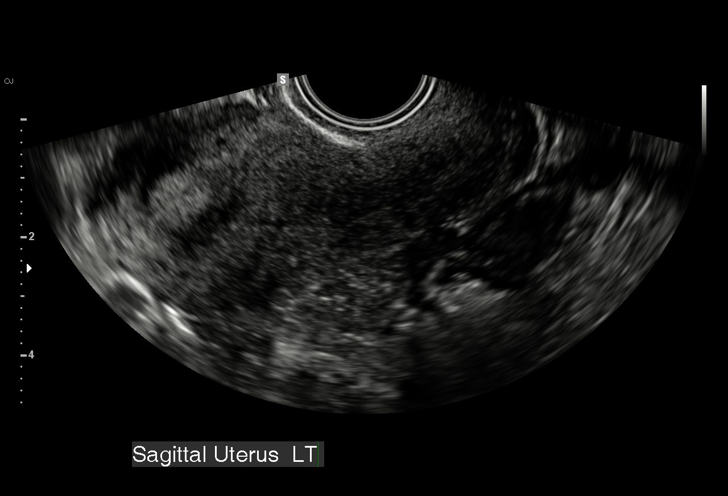
[im 33/59]
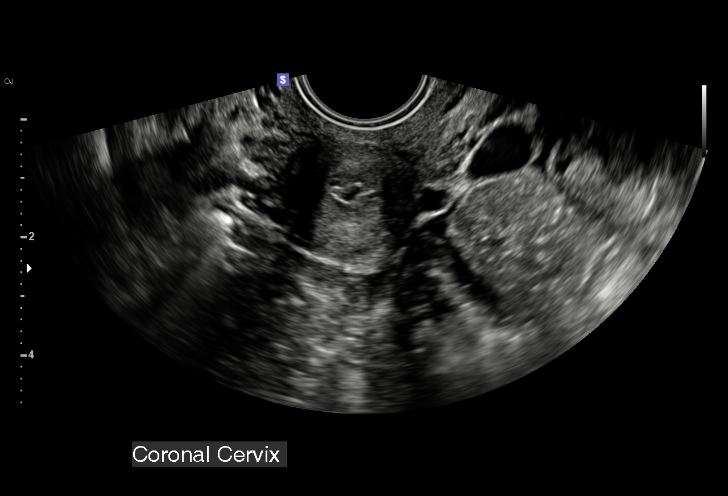
[im 37/59]
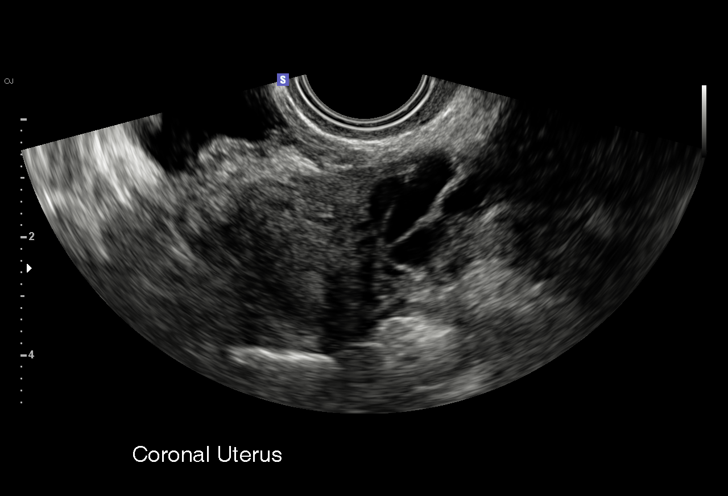
[im 41/59]
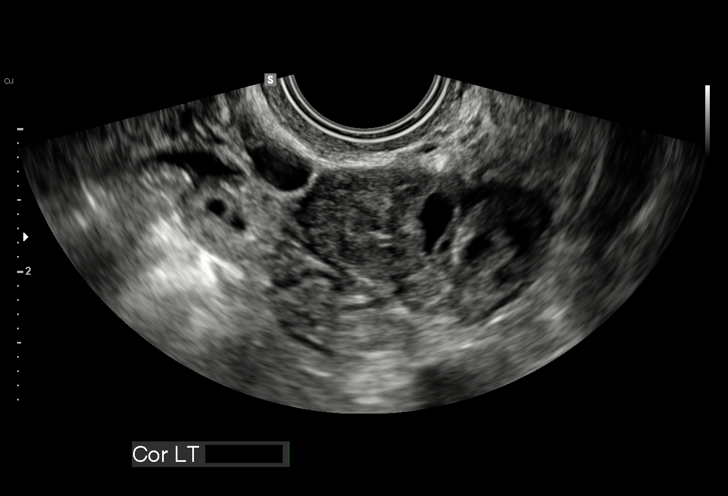
[im 46/59]
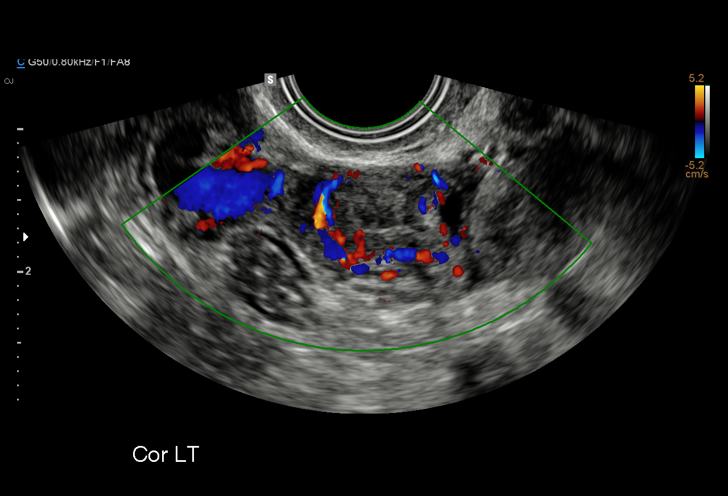
[im 50/59]
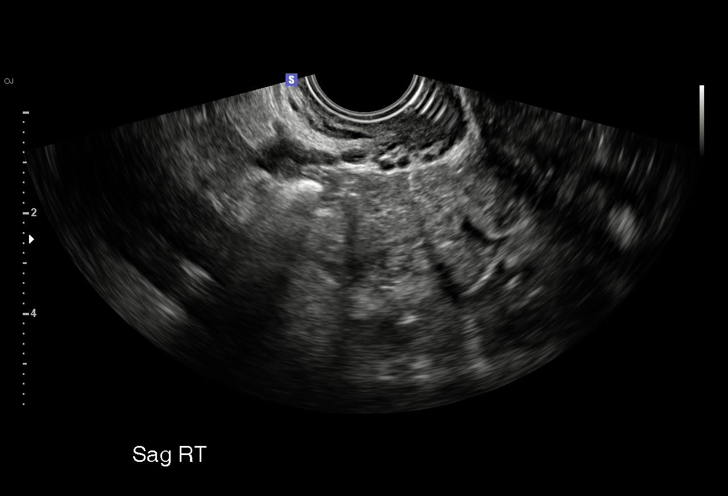
[im 54/59]
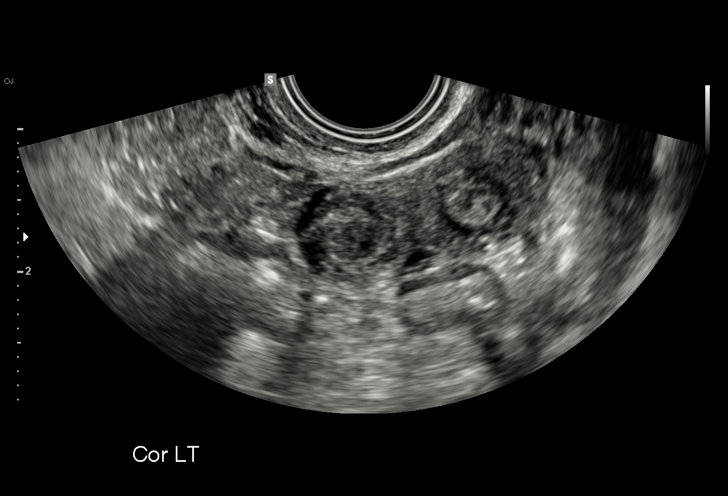
[im 59/59]
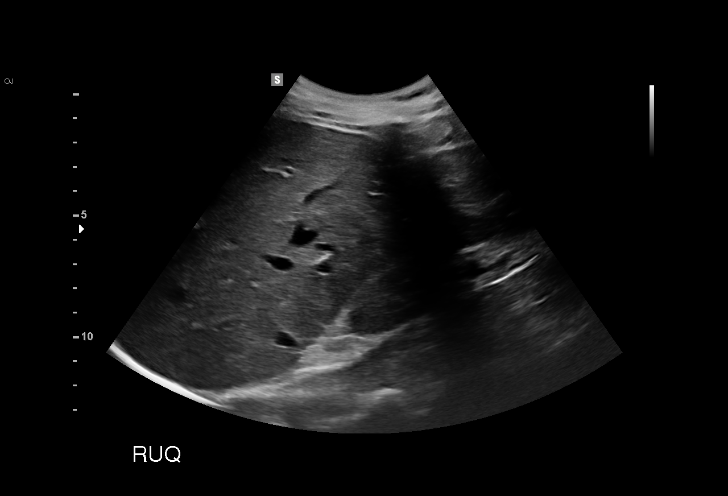

[15 of 28 positions shown; findings below may reference images not displayed]

FINDINGS: Intrauterine gestational sac: None

Yolk sac:  No

Embryo:  No

Cardiac Activity: Not apply

Subchorionic hemorrhage:  None visualized.

Maternal uterus/adnexae:

Subchorionic hemorrhage: Not applicable

Right ovary: Normal

Left ovary: There is a mass medial to the left ovary with peripheral
increased blood flow. This appears separate from the left ovary
measuring 1.6 x 1.2 x 1.3 cm. There is a central hypoechoic area
within this mass which may represent a gestational sac.

Other :None

Free fluid:  Small amount of free fluid noted within the pelvis.
IMPRESSION: 1. No intrauterine gestational sac, yolk sac, or fetal pole
identified. Differential considerations include intrauterine
pregnancy too early to be sonographically visualized, missed
abortion, or ectopic pregnancy. Followup ultrasound is recommended
in 10-14 days for further evaluation.
2. There is a left adnexal mass which appears separate from the left
ovary. There may be a central gestational sac within this mass.
Cannot rule out ectopic pregnancy.

## 2015-11-11 SURGERY — LAPAROSCOPY, DIAGNOSTIC
Anesthesia: General | Site: Abdomen | Laterality: Right

## 2015-11-11 MED ORDER — NEOSTIGMINE METHYLSULFATE 10 MG/10ML IV SOLN
INTRAVENOUS | Status: DC | PRN
  Administered 2015-11-11: 2 mg via INTRAVENOUS

## 2015-11-11 MED ORDER — LACTATED RINGERS IV SOLN
INTRAVENOUS | Status: DC
  Administered 2015-11-11: 17:00:00 via INTRAVENOUS
  Administered 2015-11-11: 1000 mL via INTRAVENOUS
  Administered 2015-11-11: 15:00:00 via INTRAVENOUS

## 2015-11-11 MED ORDER — LIDOCAINE HCL (CARDIAC) 20 MG/ML IV SOLN
INTRAVENOUS | Status: AC
  Filled 2015-11-11: qty 5

## 2015-11-11 MED ORDER — PROPOFOL 10 MG/ML IV BOLUS
INTRAVENOUS | Status: DC | PRN
  Administered 2015-11-11: 150 mg via INTRAVENOUS

## 2015-11-11 MED ORDER — LACTATED RINGERS IV BOLUS (SEPSIS)
1000.0000 mL | Freq: Once | INTRAVENOUS | Status: AC
  Administered 2015-11-11: 1000 mL via INTRAVENOUS

## 2015-11-11 MED ORDER — ALBUTEROL SULFATE HFA 108 (90 BASE) MCG/ACT IN AERS
INHALATION_SPRAY | RESPIRATORY_TRACT | Status: AC
  Filled 2015-11-11: qty 6.7

## 2015-11-11 MED ORDER — HYDROMORPHONE HCL 1 MG/ML IJ SOLN
1.0000 mg | Freq: Once | INTRAMUSCULAR | Status: AC
  Administered 2015-11-11: 1 mg via INTRAVENOUS

## 2015-11-11 MED ORDER — FENTANYL CITRATE (PF) 250 MCG/5ML IJ SOLN
INTRAMUSCULAR | Status: AC
  Filled 2015-11-11: qty 5

## 2015-11-11 MED ORDER — MIDAZOLAM HCL 2 MG/2ML IJ SOLN
INTRAMUSCULAR | Status: AC
  Filled 2015-11-11: qty 2

## 2015-11-11 MED ORDER — DEXAMETHASONE SODIUM PHOSPHATE 10 MG/ML IJ SOLN
INTRAMUSCULAR | Status: DC | PRN
  Administered 2015-11-11: 10 mg via INTRAVENOUS

## 2015-11-11 MED ORDER — ROCURONIUM BROMIDE 100 MG/10ML IV SOLN
INTRAVENOUS | Status: DC | PRN
  Administered 2015-11-11: 30 mg via INTRAVENOUS
  Administered 2015-11-11: 5 mg via INTRAVENOUS

## 2015-11-11 MED ORDER — MIDAZOLAM HCL 2 MG/2ML IJ SOLN
INTRAMUSCULAR | Status: DC | PRN
Start: 2015-11-11 — End: 2015-11-11
  Administered 2015-11-11: 2 mg via INTRAVENOUS

## 2015-11-11 MED ORDER — ONDANSETRON HCL 4 MG/2ML IJ SOLN
INTRAMUSCULAR | Status: DC | PRN
  Administered 2015-11-11: 4 mg via INTRAVENOUS

## 2015-11-11 MED ORDER — FENTANYL CITRATE (PF) 100 MCG/2ML IJ SOLN
INTRAMUSCULAR | Status: DC | PRN
  Administered 2015-11-11 (×5): 50 ug via INTRAVENOUS

## 2015-11-11 MED ORDER — ALBUTEROL SULFATE HFA 108 (90 BASE) MCG/ACT IN AERS
INHALATION_SPRAY | RESPIRATORY_TRACT | Status: DC | PRN
  Administered 2015-11-11: 4 via RESPIRATORY_TRACT

## 2015-11-11 MED ORDER — EPHEDRINE SULFATE 50 MG/ML IJ SOLN
INTRAMUSCULAR | Status: DC | PRN
  Administered 2015-11-11 (×4): 5 mg via INTRAVENOUS

## 2015-11-11 MED ORDER — SCOPOLAMINE 1 MG/3DAYS TD PT72
1.0000 | MEDICATED_PATCH | TRANSDERMAL | Status: DC
  Administered 2015-11-11: 1.5 mg via TRANSDERMAL

## 2015-11-11 MED ORDER — HYDROMORPHONE HCL 1 MG/ML IJ SOLN
INTRAMUSCULAR | Status: AC
  Administered 2015-11-11: 1 mg via INTRAVENOUS
  Filled 2015-11-11: qty 1

## 2015-11-11 MED ORDER — BUPIVACAINE-EPINEPHRINE (PF) 0.5% -1:200000 IJ SOLN
INTRAMUSCULAR | Status: AC
  Filled 2015-11-11: qty 30

## 2015-11-11 MED ORDER — FAMOTIDINE IN NACL 20-0.9 MG/50ML-% IV SOLN
20.0000 mg | Freq: Once | INTRAVENOUS | Status: AC
  Administered 2015-11-11: 20 mg via INTRAVENOUS
  Filled 2015-11-11: qty 50

## 2015-11-11 MED ORDER — HYDROMORPHONE HCL 1 MG/ML IJ SOLN
INTRAMUSCULAR | Status: AC
  Filled 2015-11-11: qty 1

## 2015-11-11 MED ORDER — SOD CITRATE-CITRIC ACID 500-334 MG/5ML PO SOLN
30.0000 mL | Freq: Once | ORAL | Status: DC
  Filled 2015-11-11: qty 15

## 2015-11-11 MED ORDER — HYDROMORPHONE HCL 1 MG/ML IJ SOLN
1.0000 mg | Freq: Once | INTRAMUSCULAR | Status: AC
  Administered 2015-11-11: 1 mg via INTRAVENOUS
  Filled 2015-11-11: qty 1

## 2015-11-11 MED ORDER — LACTATED RINGERS IV SOLN: INTRAVENOUS | Status: DC | PRN

## 2015-11-11 MED ORDER — GLYCOPYRROLATE 0.2 MG/ML IJ SOLN
INTRAMUSCULAR | Status: DC | PRN
  Administered 2015-11-11: 0.1 mg via INTRAVENOUS
  Administered 2015-11-11: .4 mg via INTRAVENOUS

## 2015-11-11 MED ORDER — OXYCODONE-ACETAMINOPHEN 5-325 MG PO TABS: 1.0000 | ORAL_TABLET | ORAL | Status: DC | PRN

## 2015-11-11 MED ORDER — DEXAMETHASONE SODIUM PHOSPHATE 10 MG/ML IJ SOLN
INTRAMUSCULAR | Status: AC
Start: 2015-11-11 — End: 2015-11-11
  Filled 2015-11-11: qty 1

## 2015-11-11 MED ORDER — SUCCINYLCHOLINE CHLORIDE 20 MG/ML IJ SOLN
INTRAMUSCULAR | Status: DC | PRN
  Administered 2015-11-11: 100 mg via INTRAVENOUS

## 2015-11-11 MED ORDER — PROPOFOL 10 MG/ML IV BOLUS
INTRAVENOUS | Status: AC
  Filled 2015-11-11: qty 20

## 2015-11-11 MED ORDER — FENTANYL CITRATE (PF) 100 MCG/2ML IJ SOLN: 25.0000 ug | INTRAMUSCULAR | Status: DC | PRN

## 2015-11-11 MED ORDER — HYDROMORPHONE HCL 1 MG/ML IJ SOLN
INTRAMUSCULAR | Status: DC | PRN
  Administered 2015-11-11: 1 mg via INTRAVENOUS

## 2015-11-11 MED ORDER — METOCLOPRAMIDE HCL 5 MG/ML IJ SOLN
INTRAMUSCULAR | Status: AC
  Filled 2015-11-11: qty 2

## 2015-11-11 MED ORDER — METOCLOPRAMIDE HCL 5 MG/ML IJ SOLN
10.0000 mg | Freq: Once | INTRAMUSCULAR | Status: AC
  Administered 2015-11-11: 10 mg via INTRAVENOUS

## 2015-11-11 MED ORDER — LIDOCAINE HCL (CARDIAC) 20 MG/ML IV SOLN
INTRAVENOUS | Status: DC | PRN
  Administered 2015-11-11: 100 mg via INTRAVENOUS

## 2015-11-11 MED ORDER — ONDANSETRON HCL 4 MG/2ML IJ SOLN
INTRAMUSCULAR | Status: AC
  Filled 2015-11-11: qty 2

## 2015-11-11 MED ORDER — SCOPOLAMINE 1 MG/3DAYS TD PT72
MEDICATED_PATCH | TRANSDERMAL | Status: AC
  Filled 2015-11-11: qty 1

## 2015-11-11 MED ORDER — ROCURONIUM BROMIDE 100 MG/10ML IV SOLN
INTRAVENOUS | Status: AC
  Filled 2015-11-11: qty 1

## 2015-11-11 MED ORDER — LACTATED RINGERS IV SOLN
INTRAVENOUS | Status: DC | PRN
  Administered 2015-11-11: 16:00:00 via INTRAVENOUS

## 2015-11-11 SURGICAL SUPPLY — 31 items
CABLE HIGH FREQUENCY MONO STRZ (ELECTRODE) IMPLANT
CANISTER SUCTION 2500CC (MISCELLANEOUS) ×4 IMPLANT
CATH ROBINSON RED A/P 16FR (CATHETERS) IMPLANT
CLOTH BEACON ORANGE TIMEOUT ST (SAFETY) ×4 IMPLANT
DECANTER SPIKE VIAL GLASS SM (MISCELLANEOUS) IMPLANT
DRSG COVADERM PLUS 2X2 (GAUZE/BANDAGES/DRESSINGS) IMPLANT
DRSG OPSITE POSTOP 3X4 (GAUZE/BANDAGES/DRESSINGS) ×4 IMPLANT
DURAPREP 26ML APPLICATOR (WOUND CARE) ×4 IMPLANT
GLOVE BIOGEL PI IND STRL 7.0 (GLOVE) ×4 IMPLANT
GLOVE BIOGEL PI INDICATOR 7.0 (GLOVE) ×4
GLOVE ECLIPSE 6.5 STRL STRAW (GLOVE) ×4 IMPLANT
GOWN STRL REUS W/TWL LRG LVL3 (GOWN DISPOSABLE) ×12 IMPLANT
LIGASURE BLUNT 5MM 37CM (INSTRUMENTS) ×4 IMPLANT
NEEDLE INSUFFLATION 120MM (ENDOMECHANICALS) IMPLANT
NS IRRIG 1000ML POUR BTL (IV SOLUTION) ×4 IMPLANT
PACK LAPAROSCOPY BASIN (CUSTOM PROCEDURE TRAY) ×4 IMPLANT
PAD TRENDELENBURG POSITION (MISCELLANEOUS) ×4 IMPLANT
POUCH SPECIMEN RETRIEVAL 10MM (ENDOMECHANICALS) IMPLANT
SET IRRIG TUBING LAPAROSCOPIC (IRRIGATION / IRRIGATOR) ×4 IMPLANT
SLEEVE XCEL OPT CAN 5 100 (ENDOMECHANICALS) ×4 IMPLANT
SOLUTION ELECTROLUBE (MISCELLANEOUS) IMPLANT
SUT MNCRL AB 4-0 PS2 18 (SUTURE) ×4 IMPLANT
SUT VICRYL 0 UR6 27IN ABS (SUTURE) ×8 IMPLANT
SUT VICRYL 4-0 PS2 18IN ABS (SUTURE) IMPLANT
TOWEL OR 17X24 6PK STRL BLUE (TOWEL DISPOSABLE) IMPLANT
TRAY FOLEY CATH SILVER 14FR (SET/KITS/TRAYS/PACK) IMPLANT
TROCAR HASSON GELL 12X100 (TROCAR) ×4 IMPLANT
TROCAR XCEL NON-BLD 11X100MML (ENDOMECHANICALS) IMPLANT
TROCAR XCEL NON-BLD 5MMX100MML (ENDOMECHANICALS) ×4 IMPLANT
WARMER LAPAROSCOPE (MISCELLANEOUS) ×4 IMPLANT
WATER STERILE IRR 1000ML POUR (IV SOLUTION) IMPLANT

## 2015-11-11 NOTE — MAU Provider Note (Signed)
Chief Complaint: Vaginal Bleeding and Abdominal Pain   First Provider Initiated Contact with Patient 11/11/15 1407     SUBJECTIVE HPI: Krystal Stanley is a 35 y.o. G60P0101 female who presents to Maternity Admissions reporting sudden onset of severe left lower quadrant pain and small amount of vaginal bleeding since 12:30. She states she started her period today. Patient is trying to conceive. Patient's last menstrual period was 10/18/2015.  Location: Left lower quadrant Quality: Sharp Severity: 10/10 on pain scale Duration: One hour Course: Worsening  Timing: Constant Modifying factors: None. Hasn't tried anything for the pain Associated signs and symptoms: Positive for vaginal bleeding. Negative for fever, chills, nausea, vomiting, diarrhea, constipation, urinary complaints, passage of tissue or clots.  Past Medical History  Diagnosis Date  . Infection CHILDHOOD    UTI  . Shortness of breath   . Asthma AGE 80    INHALER PRN  . Abnormal Pap smear 2009    COLPO; LEEP;  NL SINCE LAST PAP 04/2011  . GERD (gastroesophageal reflux disease)     with pregnancy  . Preterm delivery 08/24/2012  . Placenta abruption, delivered, current hospitalization 08/24/2012  . Oligohydramnios 08/07/2012   OB History  Gravida Para Term Preterm AB SAB TAB Ectopic Multiple Living  2 1  1      1     # Outcome Date GA Lbr Len/2nd Weight Sex Delivery Anes PTL Lv  2 Current           1 Preterm 08/08/12 [redacted]w[redacted]d 10:37 / 00:32 5 lb 10.8 oz (2.574 kg) F Vag-Spont EPI  Y     Past Surgical History  Procedure Laterality Date  . Wisdom tooth extraction  AS TEEN  . Leep    . Colposcopy     Social History   Social History  . Marital Status: Married    Spouse Name: ERIC  . Number of Children: 0  . Years of Education: 18   Occupational History  . ATHLETIC TRAINER    Social History Main Topics  . Smoking status: Never Smoker   . Smokeless tobacco: Never Used  . Alcohol Use: No     Comment: social occasional   . Drug Use: No  . Sexual Activity:    Partners: Male    Birth Control/ Protection: OCP, None     Comment: D/C'D 07/2011   Other Topics Concern  . Not on file   Social History Narrative   No current facility-administered medications on file prior to encounter.   Current Outpatient Prescriptions on File Prior to Encounter  Medication Sig Dispense Refill  . ibuprofen (ADVIL,MOTRIN) 600 MG tablet Take 1 tablet (600 mg total) by mouth every 6 (six) hours as needed for pain. 30 tablet 2  . montelukast (SINGULAIR) 10 MG tablet Take 1 tablet (10 mg total) by mouth at bedtime. 30 tablet 2  . Prenatal Vit-Fe Fumarate-FA (PRENATAL 19 PO) Take 1 tablet by mouth. OTC    . albuterol (PROVENTIL HFA;VENTOLIN HFA) 108 (90 BASE) MCG/ACT inhaler Inhale 2 puffs into the lungs every 4 (four) hours as needed. 1 Inhaler 6   Allergies  Allergen Reactions  . Other     SEASONAL AND PET DANDER - ASTHMA ; HIVES, ITCHING, WATERY EYES    I have reviewed the past Medical Hx, Surgical Hx, Social Hx, Allergies and Medications.   Review of Systems  Constitutional: Negative for fever and chills.  Gastrointestinal: Positive for abdominal pain. Negative for nausea, vomiting, diarrhea and constipation.  Genitourinary: Positive  for vaginal bleeding. Negative for dysuria, hematuria and vaginal discharge.  Musculoskeletal: Negative for back pain.  Neurological: Negative for dizziness.    OBJECTIVE Patient Vitals for the past 24 hrs:  BP Temp Pulse Resp SpO2  11/11/15 1251 103/62 mmHg - 66 - 100 %  11/11/15 1219 (!) 130/102 mmHg - 60 22 -  11/11/15 1152 (!) 152/108 mmHg 98.2 F (36.8 C) 99 18 -   Constitutional: Well-developed, well-nourished female in Severe distress. Curled up in fetal position on her left side, crying. Cardiovascular: normal rate Respiratory: normal rate and effort.  GI: Abd soft, moderate left lower quadrant tenderness, positive guarding, negative mass, negative rebound tenderness. Pos BS  x 4 MS: Extremities nontender, no edema, normal ROM Neurologic: Alert and oriented x 4.  GU: Neg CVAT.  SPECULUM EXAM: NEFG, small amount of bright red blood noted, cervix clean  BIMANUAL: cervix closed; uterus normal size, small-moderate amount of left adnexal tenderness (after Dilaudid). No mass. No right adnexal tenderness or mass. No CMT.  LAB RESULTS Results for orders placed or performed during the hospital encounter of 11/11/15 (from the past 24 hour(s))  Urinalysis, Routine w reflex microscopic (not at [facility])     Status: None   Collection Time: 11/11/15 11:50 AM  Result Value Ref Range   Color, Urine YELLOW YELLOW   APPearance CLEAR CLEAR   Specific Gravity, Urine 1.015 1.005 - 1.030   pH 7.0 5.0 - 8.0   Glucose, UA NEGATIVE NEGATIVE mg/dL   Hgb urine dipstick NEGATIVE NEGATIVE   Bilirubin Urine NEGATIVE NEGATIVE   Ketones, ur NEGATIVE NEGATIVE mg/dL   Protein, ur NEGATIVE NEGATIVE mg/dL   Nitrite NEGATIVE NEGATIVE   Leukocytes, UA NEGATIVE NEGATIVE  Pregnancy, urine POC     Status: Abnormal   Collection Time: 11/11/15 11:54 AM  Result Value Ref Range   Preg Test, Ur POSITIVE (A) NEGATIVE  hCG, quantitative, pregnancy     Status: Abnormal   Collection Time: 11/11/15 12:33 PM  Result Value Ref Range   hCG, Beta Chain, Quant, S 2675 (H) <5 mIU/mL  CBC     Status: Abnormal   Collection Time: 11/11/15 12:33 PM  Result Value Ref Range   WBC 15.7 (H) 4.0 - 10.5 K/uL   RBC 4.60 3.87 - 5.11 MIL/uL   Hemoglobin 13.8 12.0 - 15.0 g/dL   HCT 16.139.3 09.636.0 - 04.546.0 %   MCV 85.4 78.0 - 100.0 fL   MCH 30.0 26.0 - 34.0 pg   MCHC 35.1 30.0 - 36.0 g/dL   RDW 40.912.2 81.111.5 - 91.415.5 %   Platelets 233 150 - 400 K/uL  Type and screen     Status: None (Preliminary result)   Collection Time: 11/11/15 12:33 PM  Result Value Ref Range   ABO/RH(D) O POS    Antibody Screen NEG    Sample Expiration 11/14/2015   Wet prep, genital     Status: Abnormal   Collection Time: 11/11/15  1:45 PM  Result Value  Ref Range   Yeast Wet Prep HPF POC NONE SEEN NONE SEEN   Trich, Wet Prep NONE SEEN NONE SEEN   Clue Cells Wet Prep HPF POC NONE SEEN NONE SEEN   WBC, Wet Prep HPF POC FEW (A) NONE SEEN   Sperm NONE SEEN     IMAGING [place] Ob Comp Less 14 Wks  11/11/2015  CLINICAL DATA:  Left lower quadrant pain and vaginal bleeding. EXAM: OBSTETRIC <14 WK US AND TRANSVAGINAL OB US TECHNIQUE: Both transabdominal and  transvaginal ultrasound examinations were performed for complete evaluation of the gestation as well as the maternal uterus, adnexal regions, and pelvic cul-de-sac. Transvaginal technique was performed to assess early pregnancy. COMPARISON:  None. FINDINGS: Intrauterine gestational sac: None Yolk sac:  No Embryo:  No Cardiac Activity: Not apply Subchorionic hemorrhage:  None visualized. Maternal uterus/adnexae: Subchorionic hemorrhage: Not applicable Right ovary: Normal Left ovary: There is a mass medial to the left ovary with peripheral increased blood flow. This appears separate from the left ovary measuring 1.6 x 1.2 x 1.3 cm. There is a central hypoechoic area within this mass which may represent a gestational sac. Other :None Free fluid:  Small amount of free fluid noted within the pelvis. IMPRESSION: 1. No intrauterine gestational sac, yolk sac, or fetal pole identified. Differential considerations include intrauterine pregnancy too early to be sonographically visualized, missed abortion, or ectopic pregnancy. Followup ultrasound is recommended in 10-14 days for further evaluation. 2. There is a left adnexal mass which appears separate from the left ovary. There may be a central gestational sac within this mass. Cannot rule out ectopic pregnancy. Electronically Signed   By: Signa Kell M.D.   On: 11/11/2015 13:15   US Ob Transvaginal  11/11/2015  CLINICAL DATA:  Left lower quadrant pain and vaginal bleeding. EXAM: OBSTETRIC <14 WK [place] AND TRANSVAGINAL OB US TECHNIQUE: Both transabdominal and transvaginal  ultrasound examinations were performed for complete evaluation of the gestation as well as the maternal uterus, adnexal regions, and pelvic cul-de-sac. Transvaginal technique was performed to assess early pregnancy. COMPARISON:  None. FINDINGS: Intrauterine gestational sac: None Yolk sac:  No Embryo:  No Cardiac Activity: Not apply Subchorionic hemorrhage:  None visualized. Maternal uterus/adnexae: Subchorionic hemorrhage: Not applicable Right ovary: Normal Left ovary: There is a mass medial to the left ovary with peripheral increased blood flow. This appears separate from the left ovary measuring 1.6 x 1.2 x 1.3 cm. There is a central hypoechoic area within this mass which may represent a gestational sac. Other :None Free fluid:  Small amount of free fluid noted within the pelvis. IMPRESSION: 1. No intrauterine gestational sac, yolk sac, or fetal pole identified. Differential considerations include intrauterine pregnancy too early to be sonographically visualized, missed abortion, or ectopic pregnancy. Followup ultrasound is recommended in 10-14 days for further evaluation. 2. There is a left adnexal mass which appears separate from the left ovary. There may be a central gestational sac within this mass. Cannot rule out ectopic pregnancy. Electronically Signed   By: Signa Kell M.D.   On: 11/11/2015 13:15    MAU COURSE CBC, Quant, ABO/Rh, ultrasound, wet prep and GC/chlamydia culture, UA. Dilaudid.  Discussed history, labs, ultrasound, exam with Dr. Sallye Ober. She will come to see patient, determined plan of care and assume care of patient.  MDM Pain and bleeding in early pregnancy with [place] evidence of ectopic pregnancy and nothing seen in the uterus w/ quant above discriminatory zone. Hemodynamically stable.  ASSESSMENT Ectopic pregnancy  PLAN: Dr. Sallye Ober assumed care of pt.  [place], CNM 11/11/2015  2:09 PM  4

## 2015-11-11 NOTE — Brief Op Note (Signed)
11/11/2015  5:11 PM  PATIENT:  Gwenyth BouillonJulia Basil  35 y.o. female  PRE-OPERATIVE DIAGNOSIS:  ruptured ectopic pregnancy  POST-OPERATIVE DIAGNOSIS:  bleeding ectopic  pregnancy  PROCEDURE:  Laparoscopic left salpingectomy for removal of bleeding ectopic pregnancy in left fallopian tube.  SURGEON:  Surgeon(s) and Role:    * Hoover BrownsEma Emillee Talsma, MD - Primary  ASSISTANTS: Scrub Technician, Audrie  ANESTHESIA:   general  EBL:  Total I/O In: 2000 [I.V.:2000] Out: 125 [Urine:25; Blood:100]  BLOOD ADMINISTERED:none  DRAINS: none   LOCAL MEDICATIONS USED:   Marcaine with epinephrine.   SPECIMEN:  Source of Specimen:  Left fallopian tube with ectopic pregnancy  DISPOSITION OF SPECIMEN:  PATHOLOGY  COUNTS:  YES  TOURNIQUET:  * No tourniquets in log *  DICTATION: .Note written in EPIC  PLAN OF CARE: Discharge to home after PACU  PATIENT DISPOSITION:  PACU - hemodynamically stable.   Delay start of Pharmacological VTE agent (>24hrs) due to surgical blood loss or risk of bleeding: not applicable

## 2015-11-11 NOTE — MAU Note (Signed)
Pt presents to MAU with complaints of severe left lower abdominal pain that started this morning after her cycle started

## 2015-11-11 NOTE — Anesthesia Preprocedure Evaluation (Signed)
Anesthesia Evaluation  Patient identified by MRN, date of birth, ID band Patient awake    Reviewed: Allergy & Precautions, NPO status , Patient's Chart, lab work & pertinent test results  Airway Mallampati: II  TM Distance: >3 FB Neck ROM: Full    Dental  (+) Teeth Intact, Dental Advisory Given, Implants,    Pulmonary asthma ,    Pulmonary exam normal breath sounds clear to auscultation       Cardiovascular negative cardio ROS Normal cardiovascular exam Rhythm:Regular Rate:Normal     Neuro/Psych negative neurological ROS  negative psych ROS   GI/Hepatic negative GI ROS, Neg liver ROS,   Endo/Other  negative endocrine ROS  Renal/GU negative Renal ROS     Musculoskeletal negative musculoskeletal ROS (+)   Abdominal   Peds  Hematology negative hematology ROS (+) 13.8/39.3 Plt 233k   Anesthesia Other Findings Day of surgery medications reviewed with the patient.  Reproductive/Obstetrics (+) Pregnancy Ectopic pregnancy                              Anesthesia Physical Anesthesia Plan  ASA: II and emergent  Anesthesia Plan: General   Post-op Pain Management:    Induction: Intravenous, Rapid sequence and Cricoid pressure planned  Airway Management Planned: Oral ETT  Additional Equipment:   Intra-op Plan:   Post-operative Plan: Extubation in OR  Informed Consent: I have reviewed the patients History and Physical, chart, labs and discussed the procedure including the risks, benefits and alternatives for the proposed anesthesia with the patient or authorized representative who has indicated his/her understanding and acceptance.   Dental advisory given  Plan Discussed with: CRNA  Anesthesia Plan Comments: (Risks/benefits of general anesthesia discussed with patient including risk of damage to teeth, lips, gum, and tongue, nausea/vomiting, allergic reactions to medications, and the  possibility of heart attack, stroke and death.  All patient questions answered.  Patient wishes to proceed.  Last ate 0900. Will RSI, GETA for emergent case.)        Anesthesia Quick Evaluation

## 2015-11-11 NOTE — Discharge Instructions (Signed)
°  Post Anesthesia Home Care Instructions  Activity: Get plenty of rest for the remainder of the day. A responsible adult should stay with you for 24 hours following the procedure.  For the next 24 hours, DO NOT: -Drive a car -Advertising copywriterperate machinery -Drink alcoholic beverages -Take any medication unless instructed by your physician -Make any legal decisions or sign important papers.  Meals: Start with liquid foods such as gelatin or soup. Progress to regular foods as tolerated. Avoid greasy, spicy, heavy foods. If nausea and/or vomiting occur, drink only clear liquids until the nausea and/or vomiting subsides. Call your physician if vomiting continues.  Special Instructions/Symptoms: Your throat may feel dry or sore from the anesthesia or the breathing tube placed in your throat during surgery. If this causes discomfort, gargle with warm salt water. The discomfort should disappear within 24 hours.    DISCHARGE INSTRUCTIONS: Laparoscopy  CALL FOR AN APPOINTMENT FOR FOLLOW UP IN 2 WEEKS. PELVIC REST FOR 2 WEEKS. SHOWER DAY AFTER SURGERY. REMOVE BANDAGES IN 1 WEEK (CLEANSE ABDOMEN WELL AND DAB DRY)  The following instructions have been prepared to help you care for yourself upon your return home today.  Wound care:  Do not get the incision wet for the first 24 hours. The incision should be kept clean and dry.  The Band-Aids or dressings may be removed the day after surgery.  Should the incision become sore, red, and swollen after the first week, check with your doctor.  Personal hygiene:  Shower the day after your procedure.  Activity and limitations:  Do NOT drive or operate any equipment today.  Do NOT lift anything more than 15 pounds for 2-3 weeks after surgery.  Do NOT rest in bed all day.  Walking is encouraged. Walk each day, starting slowly with 5-minute walks 3 or 4 times a day. Slowly increase the length of your walks.  Walk up and down stairs slowly.  Do NOT do  strenuous activities, such as golfing, playing tennis, bowling, running, biking, weight lifting, gardening, mowing, or vacuuming for 2-4 weeks. Ask your doctor when it is okay to start.  Diet: Eat a light meal as desired this evening. You may resume your usual diet tomorrow.  Return to work: This is dependent on the type of work you do. For the most part you can return to a desk job within a week of surgery. If you are more active at work, please discuss this with your doctor.  What to expect after your surgery: You may have a slight burning sensation when you urinate on the first day. You may have a very small amount of blood in the urine. Expect to have a small amount of vaginal discharge/light bleeding for 1-2 weeks. It is not unusual to have abdominal soreness and bruising for up to 2 weeks. You may be tired and need more rest for about 1 week. You may experience shoulder pain for 24-72 hours. Lying flat in bed may relieve it.  Call your doctor for any of the following:  Develop a fever of 100.4 or greater  Inability to urinate 6 hours after discharge from hospital  Severe pain not relieved by pain medications  Persistent of heavy bleeding at incision site  Redness or swelling around incision site after a week  Increasing nausea or vomiting  Patient Signature________________________________________ Nurse Signature_________________________________________

## 2015-11-11 NOTE — MAU Note (Signed)
Dr. Sallye OberKulwa in to see pt & her husband.

## 2015-11-11 NOTE — MAU Note (Signed)
Bedside US in progress

## 2015-11-11 NOTE — Anesthesia Procedure Notes (Signed)
Procedure Name: Intubation Date/Time: 11/11/2015 3:15 PM Performed by: Graciela HusbandsFUSSELL, Alessandra Sawdey O Pre-anesthesia Checklist: Patient identified, Emergency Drugs available, Suction available, Patient being monitored and Timeout performed Patient Re-evaluated:Patient Re-evaluated prior to inductionOxygen Delivery Method: Circle system utilized Preoxygenation: Pre-oxygenation with 100% oxygen Intubation Type: IV induction, Rapid sequence and Cricoid Pressure applied Laryngoscope Size: Mac and 3 Grade View: Grade I Tube type: Oral Tube size: 7.0 mm Number of attempts: 1 Airway Equipment and Method: Stylet Placement Confirmation: positive ETCO2 and breath sounds checked- equal and bilateral Secured at: 21 cm Tube secured with: Tape Dental Injury: Teeth and Oropharynx as per pre-operative assessment

## 2015-11-11 NOTE — Op Note (Addendum)
  Krystal Stanley, Krystal Stanley DOB 04/21/1981  MRN [id number]  PREOP DIAGNOSIS:   1. Acute abdominal pain. 2.  Ruptured ectopic pregnancy.   POSTOP DIAGNOSIS:  1. Bleeding left fallopian tube ectopic pregnancy. 2. Right paratubal cysts.    PROCEDURES:   Laparoscopic left salpingectomy, paratubal cysts resection.   SURGEON: Dr. Hoover BrownsEma Dell Briner  ASSISTANT: Scrub technician: Pollyann GlenAudrie  ANESTHESIA: General  COMPLICATIONS: None  EBL: 100 (EVACUATED HEMOPERITONEUM)  IV FLUID: 2900 mL LR  URINE OUTPUT: 10 mL  FINDINGS: Normal uterus. Left fallopian tube scarred and dilated with ectopic pregnancy and blood within, with bleeding from the fimbria end.  Right fallopian tube with 2 paratubal cysts, otherwise normal appearing.  Normal left and right ovaries.     PROCEDURE:   Informed consent was obtained from the patient to undergo the procedure after discussing the risks benefits and alternatives of the procedure. We discussed risks of the procedure including potential impact on fertility as well as risks of bleeding, infection and damage to organs.  She was taken to the operating room where anesthesia was administered without difficulty. Both her arms were tucked and she was placed in the dorsal lithotomy position. She was prepped abdominally and also vaginally and perineum in the usual sterile fashion. Foley catheter was placed in the bladder and an Acorn uterine manipulator was placed vaginally.    Attention was then turned to the abdomen where lidocaine with epinephrine was instilled in the infraumbilical area. The skin was incised with a scalpel and and entry into the abdomen was made through the subcutaneous fascia and peritoneal layers using Allis clamps hemostats and S retractors for retraction. The fascia was tagged with 0 Vicryl.   The 11 mm Hasson trocar was placed in and gas 20 ml gas instilled into the bulb. The abdomen was insufflated with gas, max pressure 15mm Hg. The scope was then placed in and the  pelvis was visualized. Two 5 mm ports were placed the using the XL trocar on the right side of abdomen side under direct visualization, one trocar placed about 10 cm lateral to the umbulicus and the other one 2 FB above and medial to the anterior superior iliac spine.  The atraumatic graspers were used to visualize the above findings.    The left fallopian tube was then removed with the ligasure.  Excellent hemostasis was noted on the mesosalpinx.  Specimen was then removed through the infraumbilical port using the endocatch bag.  Two small paratubal cysts with clear fluid on the right fallopian tube were removed with ligasure and could be delivered through the 5mm ports.  The hemoperitoneum was suctioned and evacuated.  Irrigation was applied in the pelvis and suctioned out.  Excellent hemostasis noted.  All instruments were removed under direct visualization.  The CO2 gas was evacuated.    Umbilical incision was closed using 0 Vicryl in a running stich.  The skin was closed with 4-0 vicryl. The 2 side ports incisions were also closed with 4-0 Monocryl. Dermabond and honey comb/coverderm dressings were placed. The Foley catheter and the Acorn manipulator were then removed. The patient was then awoken from anesthesia and she was taken to recovery room in stable condition  SPECIMEN: Left fallopian tube with ectopic pregnancy.  Right paratubal cysts.

## 2015-11-11 NOTE — MAU Provider Note (Signed)
Chief Complaint: Vaginal Bleeding and Abdominal Pain   First Provider Initiated Contact with Patient 11/11/15 1407     SUBJECTIVE HPI: Krystal Stanley is a 35 y.o. G74P0101 female who presents to Maternity Admissions reporting sudden onset of severe left lower quadrant pain and small amount of vaginal bleeding since 12:30. She states she started her period today. Patient is trying to conceive. Patient's last menstrual period was 10/18/2015.  Location: Left lower quadrant Quality: Sharp Severity: 10/10 on pain scale Duration: One hour Course: Worsening  Timing: Constant Modifying factors: None. Hasn't tried anything for the pain Associated signs and symptoms: Positive for vaginal bleeding. Negative for fever, chills, nausea, vomiting, diarrhea, constipation, urinary complaints, passage of tissue or clots.  Past Medical History  Diagnosis Date  . Infection CHILDHOOD    UTI  . Shortness of breath   . Asthma AGE 75    INHALER PRN  . Abnormal Pap smear 2009    COLPO; LEEP;  NL SINCE LAST PAP 04/2011  . GERD (gastroesophageal reflux disease)     with pregnancy  . Preterm delivery 08/24/2012  . Placenta abruption, delivered, current hospitalization 08/24/2012  . Oligohydramnios 08/07/2012   OB History  Gravida Para Term Preterm AB SAB TAB Ectopic Multiple Living  2 1  1      1     # Outcome Date GA Lbr Len/2nd Weight Sex Delivery Anes PTL Lv  2 Current           1 Preterm 08/08/12 [redacted]w[redacted]d 10:37 / 00:32 2.574 kg (5 lb 10.8 oz) F Vag-Spont EPI  Y     Past Surgical History  Procedure Laterality Date  . Wisdom tooth extraction  AS TEEN  . Leep    . Colposcopy     Social History   Social History  . Marital Status: Married    Spouse Name: ERIC  . Number of Children: 0  . Years of Education: 18   Occupational History  . ATHLETIC TRAINER    Social History Main Topics  . Smoking status: Never Smoker   . Smokeless tobacco: Never Used  . Alcohol Use: No     Comment: social occasional   . Drug Use: No  . Sexual Activity:    Partners: Male    Birth Control/ Protection: OCP, None     Comment: D/C'D 07/2011   Other Topics Concern  . Not on file   Social History Narrative   No current facility-administered medications on file prior to encounter.   Current Outpatient Prescriptions on File Prior to Encounter  Medication Sig Dispense Refill  . ibuprofen (ADVIL,MOTRIN) 600 MG tablet Take 1 tablet (600 mg total) by mouth every 6 (six) hours as needed for pain. 30 tablet 2  . montelukast (SINGULAIR) 10 MG tablet Take 1 tablet (10 mg total) by mouth at bedtime. 30 tablet 2  . Prenatal Vit-Fe Fumarate-FA (PRENATAL 19 PO) Take 1 tablet by mouth. OTC    . albuterol (PROVENTIL HFA;VENTOLIN HFA) 108 (90 BASE) MCG/ACT inhaler Inhale 2 puffs into the lungs every 4 (four) hours as needed. 1 Inhaler 6   Allergies  Allergen Reactions  . Other     SEASONAL AND PET DANDER - ASTHMA ; HIVES, ITCHING, WATERY EYES    I have reviewed the past Medical Hx, Surgical Hx, Social Hx, Allergies and Medications.   Review of Systems  Constitutional: Negative for fever and chills.  Gastrointestinal: Positive for abdominal pain. Negative for nausea, vomiting, diarrhea and constipation.  Genitourinary: Positive  for vaginal bleeding. Negative for dysuria, hematuria and vaginal discharge.  Musculoskeletal: Negative for back pain.  Neurological: Negative for dizziness.    OBJECTIVE Patient Vitals for the past 24 hrs:  BP Temp Pulse Resp SpO2  11/11/15 1251 103/62 mmHg - 66 - 100 %  11/11/15 1219 (!) 130/102 mmHg - 60 22 -  11/11/15 1152 (!) 152/108 mmHg 98.2 F (36.8 C) 99 18 -   Constitutional: Well-developed, well-nourished female in Severe distress. Curled up in fetal position on her left side, crying. Cardiovascular: normal rate Respiratory: normal rate and effort.  GI: Abd soft, moderate left lower quadrant tenderness, positive guarding, negative mass, negative rebound tenderness. Pos BS  x 4 MS: Extremities nontender, no edema, normal ROM Neurologic: Alert and oriented x 4.  GU: Neg CVAT.  SPECULUM EXAM: NEFG, small amount of bright red blood noted, cervix clean  BIMANUAL: cervix closed; uterus normal size, small-moderate amount of left adnexal tenderness (after Dilaudid). No mass. No right adnexal tenderness or mass. No CMT.  LAB RESULTS Results for orders placed or performed during the hospital encounter of 11/11/15 (from the past 24 hour(s))  Urinalysis, Routine w reflex microscopic (not at [facility])     Status: None   Collection Time: 11/11/15 11:50 AM  Result Value Ref Range   Color, Urine YELLOW YELLOW   APPearance CLEAR CLEAR   Specific Gravity, Urine 1.015 1.005 - 1.030   pH 7.0 5.0 - 8.0   Glucose, UA NEGATIVE NEGATIVE mg/dL   Hgb urine dipstick NEGATIVE NEGATIVE   Bilirubin Urine NEGATIVE NEGATIVE   Ketones, ur NEGATIVE NEGATIVE mg/dL   Protein, ur NEGATIVE NEGATIVE mg/dL   Nitrite NEGATIVE NEGATIVE   Leukocytes, UA NEGATIVE NEGATIVE  Pregnancy, urine POC     Status: Abnormal   Collection Time: 11/11/15 11:54 AM  Result Value Ref Range   Preg Test, Ur POSITIVE (A) NEGATIVE  hCG, quantitative, pregnancy     Status: Abnormal   Collection Time: 11/11/15 12:33 PM  Result Value Ref Range   hCG, Beta Chain, Quant, S 2675 (H) <5 mIU/mL  CBC     Status: Abnormal   Collection Time: 11/11/15 12:33 PM  Result Value Ref Range   WBC 15.7 (H) 4.0 - 10.5 K/uL   RBC 4.60 3.87 - 5.11 MIL/uL   Hemoglobin 13.8 12.0 - 15.0 g/dL   HCT 96.0 45.4 - 09.8 %   MCV 85.4 78.0 - 100.0 fL   MCH 30.0 26.0 - 34.0 pg   MCHC 35.1 30.0 - 36.0 g/dL   RDW 11.9 14.7 - 82.9 %   Platelets 233 150 - 400 K/uL  Type and screen     Status: None   Collection Time: 11/11/15 12:33 PM  Result Value Ref Range   ABO/RH(D) O POS    Antibody Screen NEG    Sample Expiration 11/14/2015   Wet prep, genital     Status: Abnormal   Collection Time: 11/11/15  1:45 PM  Result Value Ref Range   Yeast  Wet Prep HPF POC NONE SEEN NONE SEEN   Trich, Wet Prep NONE SEEN NONE SEEN   Clue Cells Wet Prep HPF POC NONE SEEN NONE SEEN   WBC, Wet Prep HPF POC FEW (A) NONE SEEN   Sperm NONE SEEN     IMAGING US Ob Comp Less 14 Wks  11/11/2015  CLINICAL DATA:  Left lower quadrant pain and vaginal bleeding. EXAM: OBSTETRIC <14 WK [place] AND TRANSVAGINAL OB US TECHNIQUE: Both transabdominal and transvaginal ultrasound  examinations were performed for complete evaluation of the gestation as well as the maternal uterus, adnexal regions, and pelvic cul-de-sac. Transvaginal technique was performed to assess early pregnancy. COMPARISON:  None. FINDINGS: Intrauterine gestational sac: None Yolk sac:  No Embryo:  No Cardiac Activity: Not apply Subchorionic hemorrhage:  None visualized. Maternal uterus/adnexae: Subchorionic hemorrhage: Not applicable Right ovary: Normal Left ovary: There is a mass medial to the left ovary with peripheral increased blood flow. This appears separate from the left ovary measuring 1.6 x 1.2 x 1.3 cm. There is a central hypoechoic area within this mass which may represent a gestational sac. Other :None Free fluid:  Small amount of free fluid noted within the pelvis. IMPRESSION: 1. No intrauterine gestational sac, yolk sac, or fetal pole identified. Differential considerations include intrauterine pregnancy too early to be sonographically visualized, missed abortion, or ectopic pregnancy. Followup ultrasound is recommended in 10-14 days for further evaluation. 2. There is a left adnexal mass which appears separate from the left ovary. There may be a central gestational sac within this mass. Cannot rule out ectopic pregnancy. Electronically Signed   By: Signa Kellaylor  Stroud M.D.   On: 11/11/2015 13:15   [place] Ob Transvaginal  11/11/2015  CLINICAL DATA:  Left lower quadrant pain and vaginal bleeding. EXAM: OBSTETRIC <14 WK US AND TRANSVAGINAL OB US TECHNIQUE: Both transabdominal and transvaginal ultrasound  examinations were performed for complete evaluation of the gestation as well as the maternal uterus, adnexal regions, and pelvic cul-de-sac. Transvaginal technique was performed to assess early pregnancy. COMPARISON:  None. FINDINGS: Intrauterine gestational sac: None Yolk sac:  No Embryo:  No Cardiac Activity: Not apply Subchorionic hemorrhage:  None visualized. Maternal uterus/adnexae: Subchorionic hemorrhage: Not applicable Right ovary: Normal Left ovary: There is a mass medial to the left ovary with peripheral increased blood flow. This appears separate from the left ovary measuring 1.6 x 1.2 x 1.3 cm. There is a central hypoechoic area within this mass which may represent a gestational sac. Other :None Free fluid:  Small amount of free fluid noted within the pelvis. IMPRESSION: 1. No intrauterine gestational sac, yolk sac, or fetal pole identified. Differential considerations include intrauterine pregnancy too early to be sonographically visualized, missed abortion, or ectopic pregnancy. Followup ultrasound is recommended in 10-14 days for further evaluation. 2. There is a left adnexal mass which appears separate from the left ovary. There may be a central gestational sac within this mass. Cannot rule out ectopic pregnancy. Electronically Signed   By: Signa Kellaylor  Stroud M.D.   On: 11/11/2015 13:15    MAU COURSE CBC, Quant, ABO/Rh, ultrasound, wet prep and GC/chlamydia culture, UA. Dilaudid.  Discussed history, labs, ultrasound, exam with Dr. Sallye OberKulwa. She will come to see patient, determined plan of care and assume care of patient.  MDM Pain and bleeding in early pregnancy with US evidence of ectopic pregnancy and nothing seen in the uterus w/ quant above discriminatory zone. Hemodynamically stable.  ASSESSMENT Ectopic pregnancy  PLAN: Dr. Sallye OberKulwa assumed care of pt.  Hoover BrownsEma Kentravious Lipford, MD 11/11/2015  2:51 PM

## 2015-11-11 NOTE — H&P (Addendum)
Krystal Stanley.  DOB: [birth year]  LOV:[id number]  Chief Complaint: Vaginal Bleeding and Abdominal Pain     SUBJECTIVE HPI: Krystal Stanley is a 35 y.o. G39P0101 female who presents to Maternity Admissions reporting sudden onset of severe left lower quadrant pain and small amount of vaginal bleeding since 12:30. She states she started her period today. Patient is trying to conceive. Patient's last menstrual period was 10/18/2015.  Location: Left lower quadrant Quality: Sharp Severity: 10/10 on pain scale Duration: One hour Course: Worsening  Timing: Constant Modifying factors: None. Hasn't tried anything for the pain Associated signs and symptoms: Positive for vaginal bleeding. Negative for fever, chills, nausea, vomiting, diarrhea, constipation, urinary complaints, passage of tissue or clots.  Past Medical History  Diagnosis Date  . Infection CHILDHOOD    UTI  . Shortness of breath   . Asthma AGE 51    INHALER PRN  . Abnormal Pap smear 2009    COLPO; LEEP;  NL SINCE LAST PAP 04/2011  . GERD (gastroesophageal reflux disease)     with pregnancy  . Preterm delivery 08/24/2012  . Placenta abruption, delivered, current hospitalization 08/24/2012  . Oligohydramnios 08/07/2012   OB History  Gravida Para Term Preterm AB SAB TAB Ectopic Multiple Living  # Outcome Date GA Lbr Len/2nd Weight Sex Delivery Anes PTL Lv  2 Current           1 Preterm 08/08/12 [redacted]w[redacted]d 10:37 / 00:32 2.574 kg (5 lb 10.8 oz) F Vag-Spont EPI  Y     Past Surgical History  Procedure Laterality Date  . Wisdom tooth extraction  AS TEEN  . Leep    . Colposcopy     Social History   Social History  . Marital Status: Married    Spouse Name: ERIC  . Number of Children: 0  . Years of Education: 18   Occupational History  . ATHLETIC TRAINER    Social History Main Topics  . Smoking status: Never Smoker   . Smokeless tobacco: Never Used  . Alcohol Use: No     Comment: social occasional  . Drug Use:  No  . Sexual Activity:    Partners: Male    Birth Control/ Protection: OCP, None     Comment: D/C'D 07/2011   Other Topics Concern  . Not on file   Social History Narrative   No current facility-administered medications on file prior to encounter.   Current Outpatient Prescriptions on File Prior to Encounter  Medication Sig Dispense Refill  . ibuprofen (ADVIL,MOTRIN) 600 MG tablet Take 1 tablet (600 mg total) by mouth every 6 (six) hours as needed for pain. 30 tablet 2  . montelukast (SINGULAIR) 10 MG tablet Take 1 tablet (10 mg total) by mouth at bedtime. 30 tablet 2  . Prenatal Vit-Fe Fumarate-FA (PRENATAL 19 PO) Take 1 tablet by mouth. OTC    . albuterol (PROVENTIL HFA;VENTOLIN HFA) 108 (90 BASE) MCG/ACT inhaler Inhale 2 puffs into the lungs every 4 (four) hours as needed. 1 Inhaler 6   Allergies  Allergen Reactions  . Other     SEASONAL AND PET DANDER - ASTHMA ; HIVES, ITCHING, WATERY EYES    I have reviewed the past Medical Hx, Surgical Hx, Social Hx, Allergies and Medications.   Review of Systems  Constitutional: Negative for fever and chills.  Gastrointestinal: Positive for abdominal pain. Negative for nausea, vomiting, diarrhea and constipation.  Genitourinary: Positive for vaginal  bleeding. Negative for dysuria, hematuria and vaginal discharge.  Musculoskeletal: Negative for back pain.  Neurological: Negative for dizziness.    OBJECTIVE Patient Vitals for the past 24 hrs:  BP Temp Pulse Resp SpO2  11/11/15 1251 103/62 mmHg - 66 - 100 %  11/11/15 1219 (!) 130/102 mmHg - 60 22 -  11/11/15 1152 (!) 152/108 mmHg 98.2 F (36.8 C) 99 18 -   Constitutional: Well-developed, well-nourished female in Severe distress. Curled up in fetal position on her left side, crying. Cardiovascular: normal rate Respiratory: normal rate and effort.  GI: Abd soft, moderate left lower quadrant tenderness, positive guarding, negative mass, negative rebound tenderness. Pos BS x 4 MS:  Extremities nontender, no edema, normal ROM Neurologic: Alert and oriented x 4.  GU: Neg CVAT.  SPECULUM EXAM: NEFG, small amount of bright red blood noted, cervix clean  BIMANUAL: cervix closed; uterus normal size, small-moderate amount of left adnexal tenderness (after Dilaudid). No mass. No right adnexal tenderness or mass. No CMT.  LAB RESULTS Results for orders placed or performed during the hospital encounter of 11/11/15 (from the past 24 hour(s))  Urinalysis, Routine w reflex microscopic (not at [facility])     Status: None   Collection Time: 11/11/15 11:50 AM  Result Value Ref Range   Color, Urine YELLOW YELLOW   APPearance CLEAR CLEAR   Specific Gravity, Urine 1.015 1.005 - 1.030   pH 7.0 5.0 - 8.0   Glucose, UA NEGATIVE NEGATIVE mg/dL   Hgb urine dipstick NEGATIVE NEGATIVE   Bilirubin Urine NEGATIVE NEGATIVE   Ketones, ur NEGATIVE NEGATIVE mg/dL   Protein, ur NEGATIVE NEGATIVE mg/dL   Nitrite NEGATIVE NEGATIVE   Leukocytes, UA NEGATIVE NEGATIVE  Pregnancy, urine POC     Status: Abnormal   Collection Time: 11/11/15 11:54 AM  Result Value Ref Range   Preg Test, Ur POSITIVE (A) NEGATIVE  hCG, quantitative, pregnancy     Status: Abnormal   Collection Time: 11/11/15 12:33 PM  Result Value Ref Range   hCG, Beta Chain, Quant, S 2675 (H) <5 mIU/mL  CBC     Status: Abnormal   Collection Time: 11/11/15 12:33 PM  Result Value Ref Range   WBC 15.7 (H) 4.0 - 10.5 K/uL   RBC 4.60 3.87 - 5.11 MIL/uL   Hemoglobin 13.8 12.0 - 15.0 g/dL   HCT 16.1 09.6 - 04.5 %   MCV 85.4 78.0 - 100.0 fL   MCH 30.0 26.0 - 34.0 pg   MCHC 35.1 30.0 - 36.0 g/dL   RDW 40.9 81.1 - 91.4 %   Platelets 233 150 - 400 K/uL  Type and screen     Status: None   Collection Time: 11/11/15 12:33 PM  Result Value Ref Range   ABO/RH(D) O POS    Antibody Screen NEG    Sample Expiration 11/14/2015   Wet prep, genital     Status: Abnormal   Collection Time: 11/11/15  1:45 PM  Result Value Ref Range   Yeast Wet Prep  HPF POC NONE SEEN NONE SEEN   Trich, Wet Prep NONE SEEN NONE SEEN   Clue Cells Wet Prep HPF POC NONE SEEN NONE SEEN   WBC, Wet Prep HPF POC FEW (A) NONE SEEN   Sperm NONE SEEN     IMAGING US Ob Comp Less 14 Wks  11/11/2015  CLINICAL DATA:  Left lower quadrant pain and vaginal bleeding. EXAM: OBSTETRIC <14 WK [place] AND TRANSVAGINAL OB US TECHNIQUE: Both transabdominal and transvaginal ultrasound examinations were  performed for complete evaluation of the gestation as well as the maternal uterus, adnexal regions, and pelvic cul-de-sac. Transvaginal technique was performed to assess early pregnancy. COMPARISON:  None. FINDINGS: Intrauterine gestational sac: None Yolk sac:  No Embryo:  No Cardiac Activity: Not apply Subchorionic hemorrhage:  None visualized. Maternal uterus/adnexae: Subchorionic hemorrhage: Not applicable Right ovary: Normal Left ovary: There is a mass medial to the left ovary with peripheral increased blood flow. This appears separate from the left ovary measuring 1.6 x 1.2 x 1.3 cm. There is a central hypoechoic area within this mass which may represent a gestational sac. Other :None Free fluid:  Small amount of free fluid noted within the pelvis. IMPRESSION: 1. No intrauterine gestational sac, yolk sac, or fetal pole identified. Differential considerations include intrauterine pregnancy too early to be sonographically visualized, missed abortion, or ectopic pregnancy. Followup ultrasound is recommended in 10-14 days for further evaluation. 2. There is a left adnexal mass which appears separate from the left ovary. There may be a central gestational sac within this mass. Cannot rule out ectopic pregnancy. Electronically Signed   By: Signa Kellaylor  Stroud M.D.   On: 11/11/2015 13:15   [place] Ob Transvaginal  11/11/2015  CLINICAL DATA:  Left lower quadrant pain and vaginal bleeding. EXAM: OBSTETRIC <14 WK US AND TRANSVAGINAL OB US TECHNIQUE: Both transabdominal and transvaginal ultrasound examinations were  performed for complete evaluation of the gestation as well as the maternal uterus, adnexal regions, and pelvic cul-de-sac. Transvaginal technique was performed to assess early pregnancy. COMPARISON:  None. FINDINGS: Intrauterine gestational sac: None Yolk sac:  No Embryo:  No Cardiac Activity: Not apply Subchorionic hemorrhage:  None visualized. Maternal uterus/adnexae: Subchorionic hemorrhage: Not applicable Right ovary: Normal Left ovary: There is a mass medial to the left ovary with peripheral increased blood flow. This appears separate from the left ovary measuring 1.6 x 1.2 x 1.3 cm. There is a central hypoechoic area within this mass which may represent a gestational sac. Other :None Free fluid:  Small amount of free fluid noted within the pelvis. IMPRESSION: 1. No intrauterine gestational sac, yolk sac, or fetal pole identified. Differential considerations include intrauterine pregnancy too early to be sonographically visualized, missed abortion, or ectopic pregnancy. Followup ultrasound is recommended in 10-14 days for further evaluation. 2. There is a left adnexal mass which appears separate from the left ovary. There may be a central gestational sac within this mass. Cannot rule out ectopic pregnancy. Electronically Signed   By: Signa Kellaylor  Stroud M.D.   On: 11/11/2015 13:15    Quant HCG 11/11/15: 2765. Blood group O pos.   MAU COURSE CBC, Quant, ABO/Rh, ultrasound, wet prep and GC/chlamydia culture, UA. Dilaudid.    MDM Pain and bleeding in early pregnancy with US evidence of ectopic pregnancy and nothing seen in the uterus w/ quant above discriminatory zone. Hemodynamically stable.  ASSESSMENT Likely ruptured ectopic pregnancy  PLAN: MAnagement plan of ruptured ectopic pregnancy discussed, with strong recommendations for laparoscopy, removal of ectopic pregnancy, possible removal of left or right ovary or fallopian tube, possible laparotomy.  We discussed risks including risks of bleeding,  infection, damage to organs.  We discussed alternatives of methotrexate and expectant management.  Patient agrees to the procedure.  All questions answered and consent signed.  Patient's partner at bedside.  Dr. Sallye OberKulwa.

## 2015-11-11 NOTE — Transfer of Care (Signed)
Immediate Anesthesia Transfer of Care Note  Patient: Krystal Stanley  Procedure(s) Performed: Procedure(s): LAPAROSCOPY DIAGNOSTIC (Left) UNILATERAL SALPINGECTOMY (Right)  Patient Location: PACU  Anesthesia Type:General  Level of Consciousness: awake, alert  and oriented  Airway & Oxygen Therapy: Patient Spontanous Breathing and Patient connected to nasal cannula oxygen  Post-op Assessment: Report given to RN and Post -op Vital signs reviewed and stable  Post vital signs: Reviewed and stable  Last Vitals:  Filed Vitals:   11/11/15 1251 11/11/15 1702  BP: 103/62   Pulse: 66 93  Temp:  36.7 C  Resp:      Last Pain:  Filed Vitals:   11/11/15 1705  PainSc: 2       Patients Stated Pain Goal: 0 (11/11/15 1251)  Complications: No apparent anesthesia complications

## 2015-11-12 ENCOUNTER — Encounter (HOSPITAL_COMMUNITY): Payer: Self-pay | Admitting: Obstetrics & Gynecology

## 2015-11-12 LAB — GC/CHLAMYDIA PROBE AMP (~~LOC~~) NOT AT ARMC
CHLAMYDIA, DNA PROBE: NEGATIVE
NEISSERIA GONORRHEA: NEGATIVE

## 2015-11-12 LAB — HIV ANTIBODY (ROUTINE TESTING W REFLEX): HIV Screen 4th Generation wRfx: NONREACTIVE

## 2015-11-12 NOTE — Anesthesia Postprocedure Evaluation (Signed)
Anesthesia Post Note  Patient: Gwenyth BouillonJulia Dziedzic  Procedure(s) Performed: Procedure(s) (LRB): LAPAROSCOPY DIAGNOSTIC (Left) UNILATERAL SALPINGECTOMY (Right)  Patient location during evaluation: PACU Anesthesia Type: General Level of consciousness: awake and alert Pain management: pain level controlled Vital Signs Assessment: post-procedure vital signs reviewed and stable Respiratory status: spontaneous breathing, nonlabored ventilation, respiratory function stable and patient connected to nasal cannula oxygen Cardiovascular status: blood pressure returned to baseline and stable Postop Assessment: no signs of nausea or vomiting Anesthetic complications: no    Last Vitals:  Filed Vitals:   11/11/15 2045 11/11/15 2110  BP: 119/67 110/66  Pulse: 76 66  Temp: 36.4 C 36.4 C  Resp:  16    Last Pain:  Filed Vitals:   11/11/15 2133  PainSc: 0-No pain                 Cecile HearingStephen Edward Larron Armor

## 2015-11-28 ENCOUNTER — Inpatient Hospital Stay (HOSPITAL_COMMUNITY): Payer: BLUE CROSS/BLUE SHIELD

## 2015-11-28 ENCOUNTER — Encounter (HOSPITAL_COMMUNITY): Payer: Self-pay | Admitting: *Deleted

## 2015-11-28 ENCOUNTER — Inpatient Hospital Stay (HOSPITAL_COMMUNITY)
Admission: AD | Admit: 2015-11-28 | Discharge: 2015-11-28 | Disposition: A | Discharge status: Home / Self Care | Payer: BLUE CROSS/BLUE SHIELD | Source: Ambulatory Visit | Attending: Obstetrics & Gynecology | Admitting: Obstetrics & Gynecology

## 2015-11-28 DIAGNOSIS — Z3491 Encounter for supervision of normal pregnancy, unspecified, first trimester: Secondary | ICD-10-CM

## 2015-11-28 DIAGNOSIS — O26891 Other specified pregnancy related conditions, first trimester: Secondary | ICD-10-CM | POA: Diagnosis present

## 2015-11-28 DIAGNOSIS — O008 Other ectopic pregnancy without intrauterine pregnancy: Secondary | ICD-10-CM

## 2015-11-28 DIAGNOSIS — Z3A01 Less than 8 weeks gestation of pregnancy: Secondary | ICD-10-CM | POA: Diagnosis not present

## 2015-11-28 LAB — HCG, QUANTITATIVE, PREGNANCY: hCG, Beta Chain, Quant, S: 27577 m[IU]/mL — ABNORMAL HIGH (ref <5)

## 2015-11-28 NOTE — MAU Note (Signed)
Pt had surgery for ectopic on 5/30, was in office today for F/U - had pos pregnancy test & elevated BHCG levels.  Pt denies pain or bleeding.  Has nausea.

## 2015-11-28 NOTE — Discharge Instructions (Signed)
First Trimester of Pregnancy The first trimester of pregnancy is from week 1 until the end of week 12 (months 1 through 3). A week after a sperm fertilizes an egg, the egg will implant on the wall of the uterus. This embryo will begin to develop into a baby. Genes from you and your partner are forming the baby. The female genes determine whether the baby is a boy or a girl. At 6-8 weeks, the eyes and face are formed, and the heartbeat can be seen on ultrasound. At the end of 12 weeks, all the baby's organs are formed.  Now that you are pregnant, you will want to do everything you can to have a healthy baby. Two of the most important things are to get good prenatal care and to follow your health care provider's instructions. Prenatal care is all the medical care you receive before the baby's birth. This care will help prevent, find, and treat any problems during the pregnancy and childbirth. BODY CHANGES Your body goes through many changes during pregnancy. The changes vary from woman to woman.   You may gain or lose a couple of pounds at first.  You may feel sick to your stomach (nauseous) and throw up (vomit). If the vomiting is uncontrollable, call your health care provider.  You may tire easily.  You may develop headaches that can be relieved by medicines approved by your health care provider.  You may urinate more often. Painful urination may mean you have a bladder infection.  You may develop heartburn as a result of your pregnancy.  You may develop constipation because certain hormones are causing the muscles that push waste through your intestines to slow down.  You may develop hemorrhoids or swollen, bulging veins (varicose veins).  Your breasts may begin to grow larger and become tender. Your nipples may stick out more, and the tissue that surrounds them (areola) may become darker.  Your gums may bleed and may be sensitive to brushing and flossing.  Dark spots or blotches (chloasma,  mask of pregnancy) may develop on your face. This will likely fade after the baby is born.  Your menstrual periods will stop.  You may have a loss of appetite.  You may develop cravings for certain kinds of food.  You may have changes in your emotions from day to day, such as being excited to be pregnant or being concerned that something may go wrong with the pregnancy and baby.  You may have more vivid and strange dreams.  You may have changes in your hair. These can include thickening of your hair, rapid growth, and changes in texture. Some women also have hair loss during or after pregnancy, or hair that feels dry or thin. Your hair will most likely return to normal after your baby is born. WHAT TO EXPECT AT YOUR PRENATAL VISITS During a routine prenatal visit:  You will be weighed to make sure you and the baby are growing normally.  Your blood pressure will be taken.  Your abdomen will be measured to track your baby's growth.  The fetal heartbeat will be listened to starting around week 10 or 12 of your pregnancy.  Test results from any previous visits will be discussed. Your health care provider may ask you:  How you are feeling.  If you are feeling the baby move.  If you have had any abnormal symptoms, such as leaking fluid, bleeding, severe headaches, or abdominal cramping.  If you are using any tobacco products,   including cigarettes, chewing tobacco, and electronic cigarettes.  If you have any questions. Other tests that may be performed during your first trimester include:  Blood tests to find your blood type and to check for the presence of any previous infections. They will also be used to check for low iron levels (anemia) and Rh antibodies. Later in the pregnancy, blood tests for diabetes will be done along with other tests if problems develop.  Urine tests to check for infections, diabetes, or protein in the urine.  An ultrasound to confirm the proper growth  and development of the baby.  An amniocentesis to check for possible genetic problems.  Fetal screens for spina bifida and Down syndrome.  You may need other tests to make sure you and the baby are doing well.  HIV (human immunodeficiency virus) testing. Routine prenatal testing includes screening for HIV, unless you choose not to have this test. HOME CARE INSTRUCTIONS  Medicines  Follow your health care provider's instructions regarding medicine use. Specific medicines may be either safe or unsafe to take during pregnancy.  Take your prenatal vitamins as directed.  If you develop constipation, try taking a stool softener if your health care provider approves. Diet  Eat regular, well-balanced meals. Choose a variety of foods, such as meat or vegetable-based protein, fish, milk and low-fat dairy products, vegetables, fruits, and whole grain breads and cereals. Your health care provider will help you determine the amount of weight gain that is right for you.  Avoid raw meat and uncooked cheese. These carry germs that can cause birth defects in the baby.  Eating four or five small meals rather than three large meals a day may help relieve nausea and vomiting. If you start to feel nauseous, eating a few soda crackers can be helpful. Drinking liquids between meals instead of during meals also seems to help nausea and vomiting.  If you develop constipation, eat more high-fiber foods, such as fresh vegetables or fruit and whole grains. Drink enough fluids to keep your urine clear or pale yellow. Activity and Exercise  Exercise only as directed by your health care provider. Exercising will help you:  Control your weight.  Stay in shape.  Be prepared for labor and delivery.  Experiencing pain or cramping in the lower abdomen or low back is a good sign that you should stop exercising. Check with your health care provider before continuing normal exercises.  Try to avoid standing for long  periods of time. Move your legs often if you must stand in one place for a long time.  Avoid heavy lifting.  Wear low-heeled shoes, and practice good posture.  You may continue to have sex unless your health care provider directs you otherwise. Relief of Pain or Discomfort  Wear a good support bra for breast tenderness.   Take warm sitz baths to soothe any pain or discomfort caused by hemorrhoids. Use hemorrhoid cream if your health care provider approves.   Rest with your legs elevated if you have leg cramps or low back pain.  If you develop varicose veins in your legs, wear support hose. Elevate your feet for 15 minutes, 3-4 times a day. Limit salt in your diet. Prenatal Care  Schedule your prenatal visits by the twelfth week of pregnancy. They are usually scheduled monthly at first, then more often in the last 2 months before delivery.  Write down your questions. Take them to your prenatal visits.  Keep all your prenatal visits as directed by your   health care provider. Safety  Wear your seat belt at all times when driving.  Make a list of emergency phone numbers, including numbers for family, friends, the hospital, and police and fire departments. General Tips  Ask your health care provider for a referral to a local prenatal education class. Begin classes no later than at the beginning of month 6 of your pregnancy.  Ask for help if you have counseling or nutritional needs during pregnancy. Your health care provider can offer advice or refer you to specialists for help with various needs.  Do not use hot tubs, steam rooms, or saunas.  Do not douche or use tampons or scented sanitary pads.  Do not cross your legs for long periods of time.  Avoid cat litter boxes and soil used by cats. These carry germs that can cause birth defects in the baby and possibly loss of the fetus by miscarriage or stillbirth.  Avoid all smoking, herbs, alcohol, and medicines not prescribed by  your health care provider. Chemicals in these affect the formation and growth of the baby.  Do not use any tobacco products, including cigarettes, chewing tobacco, and electronic cigarettes. If you need help quitting, ask your health care provider. You may receive counseling support and other resources to help you quit.  Schedule a dentist appointment. At home, brush your teeth with a soft toothbrush and be gentle when you floss. SEEK MEDICAL CARE IF:   You have dizziness.  You have mild pelvic cramps, pelvic pressure, or nagging pain in the abdominal area.  You have persistent nausea, vomiting, or diarrhea.  You have a bad smelling vaginal discharge.  You have pain with urination.  You notice increased swelling in your face, hands, legs, or ankles. SEEK IMMEDIATE MEDICAL CARE IF:   You have a fever.  You are leaking fluid from your vagina.  You have spotting or bleeding from your vagina.  You have severe abdominal cramping or pain.  You have rapid weight gain or loss.  You vomit blood or material that looks like coffee grounds.  You are exposed to German measles and have never had them.  You are exposed to fifth disease or chickenpox.  You develop a severe headache.  You have shortness of breath.  You have any kind of trauma, such as from a fall or a car accident.   This information is not intended to replace advice given to you by your health care provider. Make sure you discuss any questions you have with your health care provider.   Document Released: 05/25/2001 Document Revised: 06/21/2014 Document Reviewed: 04/10/2013 Elsevier Interactive Patient Education 2016 [employer]  

## 2015-11-28 NOTE — Progress Notes (Signed)
Alphonzo Severanceachel Stall CNM on unit and aware of pt's admission and status. Will see pt

## 2015-11-28 NOTE — Progress Notes (Signed)
Krystal Stanley Stall CNM in earlier to discuss test result and d/c plan. Written and verbal d/c instructions given and understanding voiced

## 2015-11-28 NOTE — MAU Provider Note (Signed)
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  [facility] post [facility], S/p left salpingectomy due to an ectopic pregnancy. Evaluation in the office reveal a  pregnancy test and quant HCG +26,000.   Denies Cramping or bleeding.  C/o nausea.   Her history is significant for  [facility] visit on 5/30 for abdominal pain and left tube ectopic pregnancy, had lap salpingectomy, quant HCG was 2600 then. No IUP had been seen on US on 5/30.     History     Patient Active Problem List   Diagnosis Date Noted  . Lactating mother 08/10/2012  . NSVD (normal spontaneous vaginal delivery) 08/09/2012  . Preterm delivery 08/08/2012  . Placenta abruption, delivered, current hospitalization 08/08/2012  . Second degree laceration of perineum, delivered, current hospitalization 08/08/2012  . Third trimester bleeding 08/07/2012  . Oligohydramnios 08/07/2012  . Hx LEEP (loop electrosurgical excision procedure), cervix, pregnancy 02/29/2012  . Asthma--exercise induced, no recent issues, well-controlled 02/29/2012    No chief complaint on file.  HPI  OB History    Gravida Para Term Preterm AB TAB SAB Ectopic Multiple Living   2 1  1      1       Past Medical History  Diagnosis Date  . Infection CHILDHOOD    UTI  . Shortness of breath   . Asthma AGE 35    INHALER PRN  . Abnormal Pap smear 2009    COLPO; LEEP;  NL SINCE LAST PAP 04/2011  . GERD (gastroesophageal reflux disease)     with pregnancy  . Preterm delivery 08/24/2012  . Placenta abruption, delivered, current hospitalization 08/24/2012  . Oligohydramnios 08/07/2012    Past Surgical History  Procedure Laterality Date  . Wisdom tooth extraction  AS TEEN  . Leep    . Colposcopy    . Laparoscopy Left 11/11/2015    Procedure: LAPAROSCOPY DIAGNOSTIC;  Surgeon: Hoover BrownsEma Kulwa, MD;  Location: [facility];   Service: Gynecology;  Laterality: Left;  . Unilateral salpingectomy Right 11/11/2015    Procedure: UNILATERAL SALPINGECTOMY;  Surgeon: Hoover BrownsEma Kulwa, MD;  Location: [facility];  Service: Gynecology;  Laterality: Right;    Family History  Problem Relation Age of Onset  . Early death Brother 3525    ACCIDENT  . Rheum arthritis Maternal Aunt   . Arthritis Maternal Grandmother   . Hyperlipidemia Maternal Grandmother   . Heart disease Maternal Grandmother   . Arthritis Maternal Grandfather   . Alcohol abuse Maternal Grandfather   . Arthritis Paternal Grandmother   . Arthritis Paternal Grandfather   . Alcohol abuse Paternal Grandfather   . Hyperlipidemia Paternal Grandfather   . Heart disease Paternal Grandfather   . Fibromyalgia Maternal Aunt     Social History  Substance Use Topics  . Smoking status: Never Smoker   . Smokeless tobacco: Never Used  . Alcohol Use: No     Comment: social occasional    Allergies:  Allergies  Allergen Reactions  . Other     SEASONAL AND PET DANDER - ASTHMA ; HIVES, ITCHING, WATERY EYES    Prescriptions prior to admission  Medication Sig Dispense Refill Last Dose  . albuterol (PROVENTIL HFA;VENTOLIN HFA) 108 (90 BASE) MCG/ACT inhaler Inhale 2 puffs into the lungs every 4 (four) hours as needed. 1 Inhaler 6 11/27/2015 at Unknown time  .  fluticasone (FLONASE) 50 MCG/ACT nasal spray Place 1 spray into both nostrils daily.   Past Month at Unknown time  . montelukast (SINGULAIR) 10 MG tablet Take 1 tablet (10 mg total) by mouth at bedtime. 30 tablet 2 11/27/2015 at Unknown time  . oxyCODONE-acetaminophen (ROXICET) 5-325 MG tablet Take 1 tablet by mouth every 4 (four) hours as needed for severe pain. 30 tablet 0 Past Week at Unknown time  . Prenatal Vit-Fe Fumarate-FA (PRENATAL 19 PO) Take 1 tablet by mouth daily. OTC   Past Month at Unknown time    ROS Physical Exam   Blood pressure 120/77, pulse 78, temperature 98.2 F (36.8 C), temperature source Oral,  resp. rate 18, height  (1.778 m), weight 73.936 kg (163 lb), last menstrual period 10/18/2015, unknown if currently breastfeeding.  Results for orders placed or performed during the hospital encounter of 11/28/15 (from the past 24 hour(s))  hCG, quantitative, pregnancy     Status: Abnormal   Collection Time: 11/28/15  9:09 PM  Result Value Ref Range   hCG, Beta Chain, Quant, S 27577 (H) <5 mIU/mL   [place]:   FINDINGS: Intrauterine gestational sac: Single  Yolk sac: Present  Embryo: Present  Cardiac Activity: Present  Heart Rate: 117 bpm  CRL: 4.9 mm 6 w 1 d [place] EDC: 07/22/2016  Subchorionic hemorrhage: None visualized.  Maternal uterus/adnexae: Normal right and left ovaries. No free fluid in the pelvis.  IMPRESSION: Single live intrauterine gestation.   Physical Exam  Constitutional: She is oriented to person, place, and time. She appears well-developed.  HENT:  Head: Normocephalic.  Eyes: Pupils are equal, round, and reactive to light.  Neck: Normal range of motion.  Cardiovascular: Normal rate and regular rhythm.   Respiratory: Effort normal and breath sounds normal.  GI: Soft. Bowel sounds are normal.  Musculoskeletal: Normal range of motion.  Neurological: She is alert and oriented to person, place, and time.  Skin: Skin is warm and dry.  Psychiatric: She has a normal mood and affect. Her behavior is normal.    ED Course  Assessment: SIUP , CRL [redacted]w[redacted]d FHR 117bpm No subchorionic hemarrhage Adnexa - right & left ovary wnl No free fluid in pelvis  Plan: DChome in stable condition Call [facility] on Monday to Schedule NOB w/u and visit     Alphonzo Severance CNM, MSN 11/28/2015 8:15 PM

## 2015-11-29 ENCOUNTER — Telehealth: Payer: Self-pay | Admitting: Obstetrics & Gynecology

## 2015-11-29 NOTE — Telephone Encounter (Signed)
Rx sent in to pharmacy for Reglan

## 2015-12-15 LAB — OB RESULTS CONSOLE RPR: RPR: NONREACTIVE

## 2015-12-15 LAB — OB RESULTS CONSOLE GC/CHLAMYDIA
Chlamydia: NEGATIVE
GC PROBE AMP, GENITAL: NEGATIVE

## 2015-12-15 LAB — OB RESULTS CONSOLE HEPATITIS B SURFACE ANTIGEN: HEP B S AG: NEGATIVE

## 2015-12-15 LAB — OB RESULTS CONSOLE ANTIBODY SCREEN: ANTIBODY SCREEN: NEGATIVE

## 2015-12-15 LAB — OB RESULTS CONSOLE ABO/RH: RH Type: POSITIVE

## 2015-12-15 LAB — OB RESULTS CONSOLE HIV ANTIBODY (ROUTINE TESTING): HIV: NONREACTIVE

## 2015-12-15 LAB — OB RESULTS CONSOLE RUBELLA ANTIBODY, IGM: RUBELLA: IMMUNE

## 2016-06-14 NOTE — L&D Delivery Note (Signed)
Delivery Note At 12:47 AM, on Feb. 7, 2018, a viable female "Maverick Dennard Nipugene" was delivered via Vaginal, Spontaneous Delivery (Presentation:Left Occiput Anterior).  Infant born En-Caul and membranes ruptured after delivery of body.  Infant with good tone and spontaneous cry. Tactile stimulation and bulb suction given by provider and infant placed on mother's abdomen where nurse continued tactile stimulation. Infant   APGAR: 9, 9. Cord clamped, cut, and blood collected. Placenta delivered spontaneously and noted to be intact with 3VC upon inspection.  Vaginal inspection revealed a 1st degree perineal laceration that was repaired with 3-0 vicryl on SH.  No additional anesthetic necessary and patient tolerated the procedure well. Fundus firm, at the umbilicus, and bleeding small.  Mother hemodynamically stable and infant skin to skin prior to provider exit.   Family desires vascetomy for birth control and mother opts to breastfeed.  Family wishes for infant to be circumcised during inpatient stay.  Infant weight at one hour of life: 7lbs 12.8oz, 20.5in  Anesthesia:  Epidural Episiotomy:  None Lacerations:  1st Degree Perineal Suture Repair: 3.0 vicryl Est. Blood Loss (mL):    Mom to postpartum.  Baby to Couplet care / Skin to Skin.  Cherre RobinsJessica L Mikhala Kenan MSN, CNM 07/21/2016, 1:31 AM

## 2016-06-30 LAB — OB RESULTS CONSOLE GBS: GBS: NEGATIVE

## 2016-07-20 ENCOUNTER — Inpatient Hospital Stay (HOSPITAL_COMMUNITY): Payer: BLUE CROSS/BLUE SHIELD | Admitting: Anesthesiology

## 2016-07-20 ENCOUNTER — Encounter (HOSPITAL_COMMUNITY): Payer: Self-pay | Admitting: *Deleted

## 2016-07-20 ENCOUNTER — Inpatient Hospital Stay (HOSPITAL_COMMUNITY)
Admission: AD | Admit: 2016-07-20 | Discharge: 2016-07-22 | DRG: 775 | Disposition: A | Discharge status: Home / Self Care | Payer: BLUE CROSS/BLUE SHIELD | Source: Ambulatory Visit | Attending: Obstetrics and Gynecology | Admitting: Obstetrics and Gynecology

## 2016-07-20 DIAGNOSIS — J45909 Unspecified asthma, uncomplicated: Secondary | ICD-10-CM | POA: Diagnosis present

## 2016-07-20 DIAGNOSIS — Z3493 Encounter for supervision of normal pregnancy, unspecified, third trimester: Secondary | ICD-10-CM | POA: Diagnosis present

## 2016-07-20 DIAGNOSIS — K219 Gastro-esophageal reflux disease without esophagitis: Secondary | ICD-10-CM | POA: Diagnosis present

## 2016-07-20 DIAGNOSIS — O9952 Diseases of the respiratory system complicating childbirth: Secondary | ICD-10-CM | POA: Diagnosis present

## 2016-07-20 DIAGNOSIS — Z3A39 39 weeks gestation of pregnancy: Secondary | ICD-10-CM

## 2016-07-20 DIAGNOSIS — Z8751 Personal history of pre-term labor: Secondary | ICD-10-CM

## 2016-07-20 DIAGNOSIS — O9962 Diseases of the digestive system complicating childbirth: Secondary | ICD-10-CM | POA: Diagnosis present

## 2016-07-20 DIAGNOSIS — O09529 Supervision of elderly multigravida, unspecified trimester: Secondary | ICD-10-CM

## 2016-07-20 DIAGNOSIS — Z9079 Acquired absence of other genital organ(s): Secondary | ICD-10-CM

## 2016-07-20 LAB — RAPID HIV SCREEN (HIV 1/2 AB+AG)
HIV 1/2 ANTIBODIES: NONREACTIVE
HIV-1 P24 ANTIGEN - HIV24: NONREACTIVE

## 2016-07-20 LAB — CBC
HEMATOCRIT: 38.3 % (ref 36.0–46.0)
Hemoglobin: 13.9 g/dL (ref 12.0–15.0)
MCH: 31.1 pg (ref 26.0–34.0)
MCHC: 36.3 g/dL — AB (ref 30.0–36.0)
MCV: 85.7 fL (ref 78.0–100.0)
Platelets: 224 10*3/uL (ref 150–400)
RBC: 4.47 MIL/uL (ref 3.87–5.11)
RDW: 13 % (ref 11.5–15.5)
WBC: 17.7 10*3/uL — ABNORMAL HIGH (ref 4.0–10.5)

## 2016-07-20 LAB — TYPE AND SCREEN
ABO/RH(D): O POS
ANTIBODY SCREEN: NEGATIVE

## 2016-07-20 MED ORDER — SOD CITRATE-CITRIC ACID 500-334 MG/5ML PO SOLN: 30.0000 mL | ORAL | Status: DC | PRN

## 2016-07-20 MED ORDER — LACTATED RINGERS IV SOLN: 500.0000 mL | INTRAVENOUS | Status: DC | PRN

## 2016-07-20 MED ORDER — FENTANYL CITRATE (PF) 100 MCG/2ML IJ SOLN
50.0000 ug | INTRAMUSCULAR | Status: DC | PRN
Start: 2016-07-20 — End: 2016-07-21
  Administered 2016-07-20: 50 ug via INTRAVENOUS
  Filled 2016-07-20: qty 2

## 2016-07-20 MED ORDER — LIDOCAINE HCL (PF) 1 % IJ SOLN
30.0000 mL | INTRAMUSCULAR | Status: DC | PRN
  Filled 2016-07-20: qty 30

## 2016-07-20 MED ORDER — LIDOCAINE HCL (PF) 1 % IJ SOLN
INTRAMUSCULAR | Status: DC | PRN
  Administered 2016-07-20: 5 mL via EPIDURAL
  Administered 2016-07-20: 4 mL via EPIDURAL

## 2016-07-20 MED ORDER — OXYTOCIN BOLUS FROM INFUSION
500.0000 mL | Freq: Once | INTRAVENOUS | Status: AC
  Administered 2016-07-21: 500 mL via INTRAVENOUS

## 2016-07-20 MED ORDER — LACTATED RINGERS IV SOLN
INTRAVENOUS | Status: DC
  Administered 2016-07-21: 01:00:00 via INTRAVENOUS

## 2016-07-20 MED ORDER — ACETAMINOPHEN 325 MG PO TABS: 650.0000 mg | ORAL_TABLET | ORAL | Status: DC | PRN

## 2016-07-20 MED ORDER — FENTANYL 2.5 MCG/ML BUPIVACAINE 1/10 % EPIDURAL INFUSION (WH - ANES)
INTRAMUSCULAR | Status: AC
  Filled 2016-07-20: qty 100

## 2016-07-20 MED ORDER — EPHEDRINE 5 MG/ML INJ
10.0000 mg | INTRAVENOUS | Status: DC | PRN
  Filled 2016-07-20: qty 4

## 2016-07-20 MED ORDER — ONDANSETRON HCL 4 MG/2ML IJ SOLN: 4.0000 mg | Freq: Four times a day (QID) | INTRAMUSCULAR | Status: DC | PRN

## 2016-07-20 MED ORDER — PHENYLEPHRINE 40 MCG/ML (10ML) SYRINGE FOR IV PUSH (FOR BLOOD PRESSURE SUPPORT)
PREFILLED_SYRINGE | INTRAVENOUS | Status: AC
  Filled 2016-07-20: qty 20

## 2016-07-20 MED ORDER — PHENYLEPHRINE 40 MCG/ML (10ML) SYRINGE FOR IV PUSH (FOR BLOOD PRESSURE SUPPORT)
80.0000 ug | PREFILLED_SYRINGE | INTRAVENOUS | Status: DC | PRN
  Filled 2016-07-20: qty 5

## 2016-07-20 MED ORDER — FENTANYL 2.5 MCG/ML BUPIVACAINE 1/10 % EPIDURAL INFUSION (WH - ANES)
14.0000 mL/h | INTRAMUSCULAR | Status: DC | PRN
  Administered 2016-07-20: 15 mL/h via EPIDURAL

## 2016-07-20 MED ORDER — DIPHENHYDRAMINE HCL 50 MG/ML IJ SOLN: 12.5000 mg | INTRAMUSCULAR | Status: DC | PRN

## 2016-07-20 MED ORDER — OXYTOCIN 40 UNITS IN LACTATED RINGERS INFUSION - SIMPLE MED
2.5000 [IU]/h | INTRAVENOUS | Status: DC
  Filled 2016-07-20: qty 1000

## 2016-07-20 MED ORDER — LACTATED RINGERS IV SOLN: 500.0000 mL | Freq: Once | INTRAVENOUS | Status: DC

## 2016-07-20 NOTE — H&P (Signed)
Krystal Stanley is a 36 y.o. female, G2P0111 at 39.3 weeks, presenting for contractions.  Patient reports contractions began at 1830 and have been consistently getting stronger.  Patient reports active fetus and denies LOF and VB.  Patient pregnancy history is significant as listed below.   Patient Active Problem List   Diagnosis Date Noted  . H/O unilateral salpingectomy 07/21/2016  . Advanced maternal age in multigravida 07/21/2016  . History of preterm delivery 07/21/2016  . Indication for care or intervention in labor or delivery 07/20/2016  . Hx LEEP (loop electrosurgical excision procedure), cervix, pregnancy 02/29/2012  . Asthma--exercise induced, no recent issues, well-controlled 02/29/2012    History of present pregnancy: Patient entered care at 8.2 weeks.   EDC of 07/24/2016 was established by Definite LMP of 10/18/2015 and confirmed by 13wk US.   Anatomy scan:  20.5 weeks, with normal findings and an anterior placenta.   Additional US evaluations:  28.3wks & 32.4wks Significant prenatal events: -Heterotropic pregnancy with left laparoscopic salpingectomy performed at 4wks by Dr. Sallyanne HaversEK. Last evaluation:  07/20/2016 by Dr. Sallyanne HaversEK in office.  VE 4/80/0 BP 112/70 Wt 188lbs  OB History    Gravida Para Term Preterm AB Living   2 1   1 1 1    SAB TAB Ectopic Multiple Live Births       1 1 1      Past Medical History:  Diagnosis Date  . Abnormal Pap smear 2009   COLPO; LEEP;  NL SINCE LAST PAP 04/2011  . Asthma AGE 86   INHALER PRN  . GERD (gastroesophageal reflux disease)    with pregnancy  . Infection CHILDHOOD   UTI  . Oligohydramnios 08/07/2012  . Placenta abruption, delivered, current hospitalization 08/24/2012  . Preterm delivery 08/24/2012  . Shortness of breath    Past Surgical History:  Procedure Laterality Date  . COLPOSCOPY    . LAPAROSCOPY Left 11/11/2015   Procedure: LAPAROSCOPY DIAGNOSTIC;  Surgeon: Hoover BrownsEma Kulwa, MD;  Location: [facility];  Service: Gynecology;  Laterality: Left;   . LEEP    . UNILATERAL SALPINGECTOMY Right 11/11/2015   Procedure: UNILATERAL SALPINGECTOMY;  Surgeon: Hoover BrownsEma Kulwa, MD;  Location: [facility];  Service: Gynecology;  Laterality: Right;  . WISDOM TOOTH EXTRACTION  AS TEEN   Family History: family history includes Alcohol abuse in her maternal grandfather and paternal grandfather; Arthritis in her maternal grandfather, maternal grandmother, paternal grandfather, and paternal grandmother; Early death (age of onset: 225) in her brother; Fibromyalgia in her maternal aunt; Heart disease in her maternal grandmother and paternal grandfather; Hyperlipidemia in her maternal grandmother and paternal grandfather; Rheum arthritis in her maternal aunt. Social History:  reports that she has never smoked. She has never used smokeless tobacco. She reports that she does not drink alcohol or use drugs.   Prenatal Transfer Tool  Maternal Diabetes: No Genetic Screening: Normal Maternal Ultrasounds/Referrals: Normal Fetal Ultrasounds or other Referrals:  None Maternal Substance Abuse:  No Significant Maternal Medications:  Meds include: Other: Montelukast, ProAir Significant Maternal Lab Results: Lab values include: Group B Strep negative    ROS:  +Ctx, -LoF, -VB, +FM No recent illness or GI upset No headaches, visual disturbances, epigastric pain, or edema  Allergies  Allergen Reactions  . Other     SEASONAL AND PET DANDER - ASTHMA ; HIVES, ITCHING, WATERY EYES     Dilation: 5 Effacement (%): 90 Station: -1 Exam by:: Kayren EavesAshley Garvey RN  Blood pressure 139/86, pulse 80, temperature 98.6 F (37 C), temperature source  Oral, resp. rate 20, weight 85.8 kg (189 lb 4 oz), last menstrual period 10/18/2015, unknown if currently breastfeeding.  Physical Exam  Constitutional: She is oriented to person, place, and time. She appears well-developed and well-nourished. No distress.  HENT:  Head: Normocephalic and atraumatic.  Eyes: Conjunctivae are normal.  Neck:  Normal range of motion.  Cardiovascular: Normal rate, regular rhythm and normal heart sounds.   Respiratory: Effort normal and breath sounds normal.  GI: Soft. Bowel sounds are normal.  Musculoskeletal: Normal range of motion. She exhibits no edema.  Neurological: She is alert and oriented to person, place, and time.  Skin: Skin is warm and dry.    [place]: EFW: 7-71/2lbs  Presentation: Vertex  FHR: 135 bpm, Mod Var, -Decels, +Accels UCs:  Q1-55min, palpates moderate to strong  Prenatal labs: ABO, Rh: --/--/O POS, O POS (05/30 1233) Antibody: NEG (05/30 1233) Rubella:  Immune RPR: Nonreactive (07/03 0000)  HBsAg: Negative (07/03 0000)  HIV: Non Reactive (05/30 1233)  GBS: Negative (01/17 0000) Sickle cell/Hgb electrophoresis:  N/A Pap:  Unknown GC:  ND Chlamydia:  ND Other:  None  Results for orders placed or performed during the hospital encounter of 07/20/16 (from the past 24 hour(s))  CBC     Status: Abnormal   Collection Time: 07/20/16 10:14 PM  Result Value Ref Range   WBC 17.7 (H) 4.0 - 10.5 K/uL   RBC 4.47 3.87 - 5.11 MIL/uL   Hemoglobin 13.9 12.0 - 15.0 g/dL   HCT 16.1 09.6 - 04.5 %   MCV 85.7 78.0 - 100.0 fL   MCH 31.1 26.0 - 34.0 pg   MCHC 36.3 (H) 30.0 - 36.0 g/dL   RDW 40.9 81.1 - 91.4 %   Platelets 224 150 - 400 K/uL  Type and screen [facility]     Status: None   Collection Time: 07/20/16 10:14 PM  Result Value Ref Range   ABO/RH(D) O POS    Antibody Screen NEG    Sample Expiration 07/23/2016   Rapid HIV screen (HIV 1/2 Ab+Ag) ([facility] Only)     Status: None   Collection Time: 07/20/16 10:15 PM  Result Value Ref Range   HIV-1 P24 Antigen - HIV24 NON REACTIVE NON REACTIVE   HIV 1/2 Antibodies NON REACTIVE NON REACTIVE   Interpretation (HIV Ag Ab)      A non reactive test result means that HIV 1 or HIV 2 antibodies and HIV 1 p24 antigen were not detected in the specimen.     Assessment IUP at 39.3wks Cat I FT Active Labor GBS  Negative AMA Asthma Heterotopic Pregnancy  Plan: Admit to [employer]  Routine Labor and Delivery Orders per [facility] Protocol In room to complete assessment and discuss POC: Expectant Management Okay for epidural as desired Dr.SR updated on patient admit and status  Joellyn Quails, MSN 07/20/2016, 10:09 PM

## 2016-07-20 NOTE — Anesthesia Preprocedure Evaluation (Signed)
Anesthesia Evaluation  Patient identified by MRN, date of birth, ID band Patient awake    Reviewed: Allergy & Precautions, Patient's Chart, lab work & pertinent test results  Airway Mallampati: II       Dental no notable dental hx. (+) Teeth Intact   Pulmonary shortness of breath and with exertion, asthma ,    Pulmonary exam normal breath sounds clear to auscultation       Cardiovascular negative cardio ROS Normal cardiovascular exam Rhythm:Regular Rate:Normal     Neuro/Psych negative neurological ROS  negative psych ROS   GI/Hepatic Neg liver ROS, GERD  Medicated and Controlled,  Endo/Other  negative endocrine ROS  Renal/GU negative Renal ROS  negative genitourinary   Musculoskeletal negative musculoskeletal ROS (+)   Abdominal   Peds  Hematology negative hematology ROS (+)   Anesthesia Other Findings   Reproductive/Obstetrics (+) Pregnancy                             Anesthesia Physical Anesthesia Plan  ASA: II  Anesthesia Plan: Epidural   Post-op Pain Management:    Induction:   Airway Management Planned: Natural Airway  Additional Equipment:   Intra-op Plan:   Post-operative Plan:   Informed Consent: I have reviewed the patients History and Physical, chart, labs and discussed the procedure including the risks, benefits and alternatives for the proposed anesthesia with the patient or authorized representative who has indicated his/her understanding and acceptance.     Plan Discussed with: Anesthesiologist  Anesthesia Plan Comments:         Anesthesia Quick Evaluation

## 2016-07-20 NOTE — Anesthesia Procedure Notes (Signed)
Epidural Patient location during procedure: OB Start time: 07/20/2016 10:59 PM  Staffing Anesthesiologist: Mal AmabileFOSTER, Gillian Meeuwsen Performed: anesthesiologist   Preanesthetic Checklist Completed: patient identified, site marked, surgical consent, pre-op evaluation, timeout performed, IV checked, risks and benefits discussed and monitors and equipment checked  Epidural Patient position: sitting Prep: site prepped and draped and DuraPrep Patient monitoring: continuous pulse ox and blood pressure Approach: midline Location: L4-L5 Injection technique: LOR air  Needle:  Needle type: Tuohy  Needle gauge: 17 G Needle length: 9 cm and 9 Needle insertion depth: 4 cm Catheter type: closed end flexible Catheter size: 19 Gauge Catheter at skin depth: 9 cm Test dose: negative and Other  Assessment Events: blood not aspirated, injection not painful, no injection resistance, negative IV test and no paresthesia  Additional Notes Patient identified. Risks and benefits discussed including failed block, incomplete  Pain control, post dural puncture headache, nerve damage, paralysis, blood pressure Changes, nausea, vomiting, reactions to medications-both toxic and allergic and post Partum back pain. All questions were answered. Patient expressed understanding and wished to proceed. Sterile technique was used throughout procedure. Epidural site was Dressed with sterile barrier dressing. No paresthesias, signs of intravascular injection Or signs of intrathecal spread were encountered.  Patient was more comfortable after the epidural was dosed. Please see RN's note for documentation of vital signs and FHR which are stable.

## 2016-07-20 NOTE — MAU Note (Signed)
PT  SAYS UC  HURT  BAD  SINCE 6PM.   VE IN OFFICE    TODAY.     STRIPPED  MEMBRANES.     DENIES  HSV AND MRSA.  GBS- NEG

## 2016-07-21 ENCOUNTER — Encounter (HOSPITAL_COMMUNITY): Payer: Self-pay

## 2016-07-21 DIAGNOSIS — Z9079 Acquired absence of other genital organ(s): Secondary | ICD-10-CM

## 2016-07-21 DIAGNOSIS — O09529 Supervision of elderly multigravida, unspecified trimester: Secondary | ICD-10-CM

## 2016-07-21 DIAGNOSIS — Z8751 Personal history of pre-term labor: Secondary | ICD-10-CM

## 2016-07-21 LAB — CBC
HEMATOCRIT: 33.5 % — AB (ref 36.0–46.0)
Hemoglobin: 12.1 g/dL (ref 12.0–15.0)
MCH: 31 pg (ref 26.0–34.0)
MCHC: 36.1 g/dL — ABNORMAL HIGH (ref 30.0–36.0)
MCV: 85.9 fL (ref 78.0–100.0)
Platelets: 198 10*3/uL (ref 150–400)
RBC: 3.9 MIL/uL (ref 3.87–5.11)
RDW: 13 % (ref 11.5–15.5)
WBC: 23.6 10*3/uL — AB (ref 4.0–10.5)

## 2016-07-21 LAB — RPR: RPR Ser Ql: NONREACTIVE

## 2016-07-21 MED ORDER — PRENATAL MULTIVITAMIN CH
1.0000 | ORAL_TABLET | Freq: Every day | ORAL | Status: DC
  Administered 2016-07-21 – 2016-07-22 (×2): 1 via ORAL
  Filled 2016-07-21 (×2): qty 1

## 2016-07-21 MED ORDER — COCONUT OIL OIL
1.0000 "application " | TOPICAL_OIL | Status: DC | PRN
  Administered 2016-07-22: 1 via TOPICAL
  Filled 2016-07-21: qty 120

## 2016-07-21 MED ORDER — DIBUCAINE 1 % RE OINT: 1.0000 "application " | TOPICAL_OINTMENT | RECTAL | Status: DC | PRN

## 2016-07-21 MED ORDER — ACETAMINOPHEN 325 MG PO TABS: 650.0000 mg | ORAL_TABLET | ORAL | Status: DC | PRN

## 2016-07-21 MED ORDER — BENZOCAINE-MENTHOL 20-0.5 % EX AERO: 1.0000 "application " | INHALATION_SPRAY | CUTANEOUS | Status: DC | PRN

## 2016-07-21 MED ORDER — ONDANSETRON HCL 4 MG/2ML IJ SOLN: 4.0000 mg | INTRAMUSCULAR | Status: DC | PRN

## 2016-07-21 MED ORDER — ZOLPIDEM TARTRATE 5 MG PO TABS: 5.0000 mg | ORAL_TABLET | Freq: Every evening | ORAL | Status: DC | PRN

## 2016-07-21 MED ORDER — TETANUS-DIPHTH-ACELL PERTUSSIS 5-2.5-18.5 LF-MCG/0.5 IM SUSP: 0.5000 mL | Freq: Once | INTRAMUSCULAR | Status: DC

## 2016-07-21 MED ORDER — SIMETHICONE 80 MG PO CHEW: 80.0000 mg | CHEWABLE_TABLET | ORAL | Status: DC | PRN

## 2016-07-21 MED ORDER — SENNOSIDES-DOCUSATE SODIUM 8.6-50 MG PO TABS
2.0000 | ORAL_TABLET | ORAL | Status: DC
  Administered 2016-07-21: 2 via ORAL
  Filled 2016-07-21: qty 2

## 2016-07-21 MED ORDER — DIPHENHYDRAMINE HCL 25 MG PO CAPS: 25.0000 mg | ORAL_CAPSULE | Freq: Four times a day (QID) | ORAL | Status: DC | PRN

## 2016-07-21 MED ORDER — WITCH HAZEL-GLYCERIN EX PADS: 1.0000 "application " | MEDICATED_PAD | CUTANEOUS | Status: DC | PRN

## 2016-07-21 MED ORDER — ONDANSETRON HCL 4 MG PO TABS: 4.0000 mg | ORAL_TABLET | ORAL | Status: DC | PRN

## 2016-07-21 MED ORDER — IBUPROFEN 600 MG PO TABS
600.0000 mg | ORAL_TABLET | Freq: Four times a day (QID) | ORAL | Status: DC
  Administered 2016-07-21 – 2016-07-22 (×6): 600 mg via ORAL
  Filled 2016-07-21 (×6): qty 1

## 2016-07-21 NOTE — Anesthesia Postprocedure Evaluation (Signed)
Anesthesia Post Note  Patient: Krystal BouillonJulia Strine  Procedure(s) Performed: * No procedures listed *  Patient location during evaluation: Mother Baby Anesthesia Type: Epidural Level of consciousness: awake Pain management: pain level controlled Vital Signs Assessment: post-procedure vital signs reviewed and stable Respiratory status: spontaneous breathing Cardiovascular status: stable Postop Assessment: no headache, no backache, epidural receding and patient able to bend at knees Anesthetic complications: no        Last Vitals:  Vitals:   07/21/16 0518 07/21/16 0838  BP: 126/73 117/79  Pulse: 66 87  Resp: 18 20  Temp: 36.7 C 37.1 C    Last Pain:  Vitals:   07/21/16 0838  TempSrc: Oral  PainSc:    Pain Goal:                 Edison PaceWILKERSON,Azreal Stthomas

## 2016-07-21 NOTE — Lactation Note (Signed)
This note was copied from a baby's chart. Lactation Consultation Note  Assisted experienced BF mother to latch infant to the right breast. Showed mom waking techniques. Baby latched after a few tries and with minimal stimulation suckled well. Mom denies discomfort. Taught hand expression and colostrum was easily expressed, basic teaching done. Informed of support groups and outpatient services.  Patient Name: Krystal Gwenyth BouillonJulia Meuth RUEAV'WToday's Date: 07/21/2016 Reason for consult: Initial assessment   Maternal Data Has patient been taught Hand Expression?: Yes Does the patient have breastfeeding experience prior to this delivery?: Yes  Feeding Feeding Type: Breast Fed Length of feed:  (observed 15 min)  LATCH Score/Interventions Latch: Repeated attempts needed to sustain latch, nipple held in mouth throughout feeding, stimulation needed to elicit sucking reflex.  Audible Swallowing: A few with stimulation  Type of Nipple: Everted at rest and after stimulation  Comfort (Breast/Nipple): Soft / non-tender     Hold (Positioning): Assistance needed to correctly position infant at breast and maintain latch.  LATCH Score: 7  Lactation Tools Discussed/Used     Consult Status Consult Status: Follow-up Date: 07/22/16 Follow-up type: In-patient    Soyla DryerJoseph, Jerzy Roepke 07/21/2016, 12:25 PM

## 2016-07-22 NOTE — Lactation Note (Signed)
This note was copied from a baby's chart. Lactation Consultation Note: Experienced BF mom, called to observe latch. She is not sure he is getting anything. Assisted mom with hand expression and she was able to see a few drops of Colostrum- Reassurance given. Baby had circ this morning and is sleepy Did latch and nurse for 10 min then going off to sleep, Reviewed our phone number, OP appointments and [facility] as resources for support after DC.  Has OP appointment for Thursday. Encouragement given.   Patient Name: Boy Gwenyth BouillonJulia Travelstead ZOXWR'UToday's Date: 07/22/2016 Reason for consult: Follow-up assessment   Maternal Data    Feeding Feeding Type: Breast Fed Length of feed: 10 min  LATCH Score/Interventions Latch: Grasps breast easily, tongue down, lips flanged, rhythmical sucking.  Audible Swallowing: A few with stimulation  Type of Nipple: Everted at rest and after stimulation  Comfort (Breast/Nipple): Soft / non-tender     Hold (Positioning): Assistance needed to correctly position infant at breast and maintain latch.  LATCH Score: 8  Lactation Tools Discussed/Used Pump Review: Setup, frequency, and cleaning Initiated by:: js Date initiated:: 07/22/16   Consult Status Consult Status: Complete    Pamelia HoitWeeks, Denajah Farias D 07/22/2016, 12:17 PM

## 2016-07-22 NOTE — Discharge Instructions (Signed)

## 2016-07-22 NOTE — Lactation Note (Signed)
This note was copied from a baby's chart. Lactation Consultation Note  Patient Name: Krystal Stanley   Baby being taken for circ.  Mother's nipples are sore and developing a crack at top of R nipple so she is starting to pump. Provided mother w/ coconut oil. Discussed how breast milk comes to volume.  Mother has DEBP at home.  Mother asked if she could have nipple shield ( used w/ previous child). Fitted mother w/ #20NS with teach back on how to apply.  Provided her with a #24NS also. Discussed that if she is using NS consistently, she will need to pump 4 times a 10-15 min and give volume back to baby. Mom encouraged to feed baby 8-12 times/24 hours and with feeding cues.  Set up OP for 2/15 10:30 am.  Referral complete. Reviewed engorgement care and monitoring voids/stools.      Maternal Data    Feeding Feeding Type: Breast Fed Length of feed: 45 min  LATCH Score/Interventions Latch: Grasps breast easily, tongue down, lips flanged, rhythmical sucking.  Audible Swallowing: A few with stimulation  Type of Nipple: Everted at rest and after stimulation  Comfort (Breast/Nipple): Soft / non-tender     Hold (Positioning): No assistance needed to correctly position infant at breast.  LATCH Score: 9  Lactation Tools Discussed/Used Pump Review: Setup, frequency, and cleaning Initiated by:: js Date initiated:: 07/22/16   Consult Status      Dahlia ByesBerkelhammer, [name], 10:32 AM

## 2016-07-22 NOTE — Discharge Summary (Signed)
OB Discharge Summary     Patient Name: Krystal BouillonJulia Cobbs DOB: 09/05/1980 MRN: [id number]  Date of admission: 07/20/2016 Delivering MD: Gerrit HeckEMLY, JESSICA   Date of delivery: 07/21/16  Date of discharge: 07/22/2016  Admitting diagnosis: 39wks delivery Intrauterine pregnancy: 6452w4d     Secondary diagnosis:  Principal Problem:   Vaginal delivery Active Problems:   H/O unilateral salpingectomy   Advanced maternal age in multigravida   History of preterm delivery  Additional problems: None     Discharge diagnosis: Term Pregnancy Delivered                                                                                                Post partum procedures:None  Augmentation: None  Complications: None  Hospital course:  Onset of Labor With Vaginal Delivery     36 y.o. yo [id number] at 3552w4d was admitted in Active Labor on 07/20/2016. Patient had an uncomplicated labor course as follows:  Membrane Rupture Time/Date: 12:47 AM ,07/21/2016   Intrapartum Procedures: Episiotomy: None [1]                                         Lacerations:  1st degree [2];Perineal [11]  Patient had a delivery of a Viable infant. 07/21/2016  Information for the patient's newborn:  Elzie RingsBrown, Boy Anabel [[id number]][[id number]]  Delivery Method: Vaginal, Spontaneous Delivery (Filed from Delivery Summary)    Pateint had an uncomplicated postpartum course.  She is ambulating, tolerating a regular diet, passing flatus, and urinating well. Patient is discharged home in stable condition on 07/22/16.   Physical exam  Vitals:   07/21/16 0518 07/21/16 0838 07/21/16 1745 07/22/16 0520  BP: 126/73 117/79 118/83 116/77  Pulse: 66 87 84 78  Resp: 18 20 18 18   Temp: 98 F (36.7 C) 98.7 F (37.1 C) 97.8 F (36.6 C) 98 F (36.7 C)  TempSrc: Oral Oral Oral Oral  SpO2:      Weight:      Height:       General: alert Lochia: appropriate Uterine Fundus: firm Incision: Healing well with no significant drainage DVT Evaluation: No evidence of DVT  seen on physical exam. Negative Homan's sign. Labs: Lab Results  Component Value Date   WBC 23.6 (H) 07/21/2016   HGB 12.1 07/21/2016   HCT 33.5 (L) 07/21/2016   MCV 85.9 07/21/2016   PLT 198 07/21/2016   CMP Latest Ref Rng & Units 08/07/2012  Glucose 70 - 99 mg/dL 97  BUN 6 - 23 mg/dL 11  Creatinine 1.300.50 - 8.651.10 mg/dL 7.840.71  Sodium 696135 - 295145 mEq/L 134(L)  Potassium 3.5 - 5.1 mEq/L 3.7  Chloride 96 - 112 mEq/L 100  CO2 19 - 32 mEq/L 21  Calcium 8.4 - 10.5 mg/dL 9.8  Total Protein 6.0 - 8.3 g/dL 6.2  Total Bilirubin 0.3 - 1.2 mg/dL 0.6  Alkaline Phos 39 - 117 U/L 148(H)  AST 0 - 37 U/L 18  ALT 0 - 35 U/L 20    Discharge instruction:  per After Visit Summary and "Baby and Me Booklet".  After visit meds:  Allergies as of 07/22/2016      Reactions   Other    SEASONAL AND PET DANDER - ASTHMA ; HIVES, ITCHING, WATERY EYES      Medication List    STOP taking these medications   Doxylamine-Pyridoxine 10-10 MG Tbec     TAKE these medications   albuterol 108 (90 Base) MCG/ACT inhaler Commonly known as:  PROVENTIL HFA;VENTOLIN HFA Inhale 2 puffs into the lungs every 4 (four) hours as needed.   calcium carbonate 750 MG chewable tablet Commonly known as:  TUMS EX Chew 2 tablets by mouth 4 (four) times daily as needed for heartburn.   montelukast 10 MG tablet Commonly known as:  SINGULAIR Take 1 tablet (10 mg total) by mouth at bedtime.   PRENATAL 19 PO Take 1 tablet by mouth daily. OTC     Patient will use OTC Advil.  Diet: routine diet  Activity: Advance as tolerated. Pelvic rest for 6 weeks.   Outpatient follow up:6 weeks Follow up Appt:No future appointments. Follow up Visit:No Follow-up on file.  Postpartum contraception: Vasectomy  Newborn Data: Live born female, Maverick Dennard Nip Birth Weight: 7 lb 12.8 oz (3538 g) APGAR: 9, 9 Circumcision done in hospital  Baby Feeding: Breast Disposition:home with mother   07/22/2016 Nigel Bridgeman, CNM

## 2016-07-29 ENCOUNTER — Ambulatory Visit: Payer: Self-pay

## 2016-07-29 NOTE — Lactation Note (Signed)
This note was copied from a baby's chart. Lactation Consult - [name] and mom Krystal Stanley present for 10:30 am . Per mom baby last fed at 930 am for 8 mins both breast. Last weight was with [facility] 2/13 - 7-12 oz ( back to birth weight ). Baby feeds every 3-4 hours for 30 -40 mins and seems satisfied after feeding.Voids> 6 wets . Stools- 4 or greater. Pumping 1-2 x's day , and especially after am feeding with 1-2 oz obtained. No supplementing needed. Per mom the soreness initially with latch is intense and then eases up, sometimes feel it during the feeding.  Please see Lactation Impression and Lactation plan below   Mother's reason for visit: to check latch , and sore nipples  Visit Type:  Feeding assessment  Appointment Notes: 518 days old, baby cluster feeding , SN- cracked starting . Mom used NS  1sr baby and eventually weaned the baby off. Tried this 2nd abby with a NS but her didn't want any part of it.  Consult:  Initial Lactation Consultant:  Krystal GreathouseMargaret Ann Elyanah Stanley  ________________________________________________________________________ Krystal FloresBaby's Name:  Krystal Stanley Date of Birth:  07/21/2016 Pediatrician:  Dr. Cyndia BentBadger  Gender:  female Gestational Age: 5470w4d (At Birth) Birth Weight:  7 lb 12.8 oz (3538 g) Weight at Discharge:                          Date of Discharge:   There were no vitals filed for this visit. [facility] 07[facility]27/2016 - 7-12 oz      Location:[facility] Weight today: 8-0.4 oz 3642 g   _____________________________________________________________________  Mother's Name: Krystal Stanley Type of delivery:  Vaginal, Spontaneous Delivery Breastfeeding Experience:  2nd baby , 1st baby breast fed 1 year ( early baby and had to use a NS and eventually weaned the baby off  Maternal Medical Conditions:  Asthma -  Maternal Medications:  PNV , Singular , Albuteral inhaler PRN    ________________________________________________________________________  Breastfeeding History (Post Discharge) - see above note  ________________________________________________________________________  Maternal Breast Assessment  Breast:  Full Nipple:  Erect - bruising noted upper portion of of both nipples , no breakdown noted , areola compressible Pain level:  3 Pain interventions:  Comfort gels and Expressed breast milk  _______________________________________________________________________ Feeding Assessment/Evaluation  Initial feeding assessment:  Infant's oral assessment:  Variance - LC assessed oral cavity with gloved fingers , noted a short labial frenulum just above the gum line, upper lip stretches well with exam and with the latch both breast,short anterior frenulum noted and baby only extends tongue short distance past gumline before and after the feeding, some lateralization of the right and left side of tongue noted and high palate .   Positioning:  Cross cradle Left breast  LATCH documentation:  Latch:  2 = Grasps breast easily, tongue down, lips flanged, rhythmical sucking.  Audible swallowing:  2 = Spontaneous and intermittent  Type of nipple:  2 = Everted at rest and after stimulation  Comfort (Breast/Nipple):  1 = Filling, red/small blisters or bruises, mild/mod discomfort  Hold (Positioning):  1 = Assistance needed to correctly position infant at breast and maintain latch  LATCH score: 8   Attached assessment:  Shallow @ 1st , had to flip upper lip to flanged position   Lips flanged:  Yes.    Lips untucked:  No.  Suck assessment:  Nutritive  Tools:  Comfort gels ( no tools needed at for latch )  Instructed on use and cleaning of tool:  Yes.    Pre-feed weight: 3642 g , 8-0.4 oz  Post-feed weight: 3656 g , 8-1.0 oz  Amount transferred:  14 ml  Amount supplemented:  None   Additional Feeding Assessment -  Positioning:  Football Right  breast  LATCH documentation:  Latch:  2 = Grasps breast easily, tongue down, lips flanged, rhythmical sucking.  Audible swallowing:  2 = Spontaneous and intermittent  Type of nipple:  2 = Everted at rest and after stimulation  Comfort (Breast/Nipple):  1 = Filling, red/small blisters or bruises, mild/mod discomfort  Hold (Positioning):  1 = Assistance needed to correctly position infant at breast and maintain latch  LATCH score: 8   Attached assessment:  Shallow  Lips flanged:  No.  Lips untucked:  No.  Suck assessment:  Nutritive  Tools: none needed for latch   1 wet , 3 stools changed before 2nd feeding   IPre-feed weight: 3618 g , 7-15.6 oz  Post-feed weight: 3660 g , 8-1.1 oz  Amount transferred: 42 ml  Amount supplemented:  None  Total amount pumped post feed:  No need to pump  Total amount transferred:  56 ml  Total supplement given: none   Lactation Impression:  Baby is gaining steadily and above birth weight at 37 days old - excellent  Baby transferred off 56 ml total off both breast excellent for 8 day old.  And also had eat'en 1 hour prior to consult.  Oral variance noted with LC's exam : Infant's oral assessment:  Variance - LC assessed oral cavity with gloved fingers , noted a short labial frenulum just above the gum line, upper lip stretches well with exam and with the latch both breast,short anterior frenulum noted and baby only extends tongue short distance past gumline before and after the feeding, some lateralization of the right and left side of tongue noted and high palate .  Once  deep latch was  obtained mom was  comfortable. ( @ consult - LC worked with mom to obtain depth and position)  LC recommended tips ( steps for latching ) that would aid in depth. And mentioned if those steps don't help to decrease moms discomfort LC recommends having baby assessed by and oral specialist for the labial frenulum and short anterior .  Therefore preventing decreased milk  supply and to enhance the moms pleasure with breast feeding.  Mom had mentioned she had noticed this baby doesn't stick out her tongue  like her 1st baby .   Lactation Plan of care:  Keep up the great efforts breast feeding Sore nipple tx:  EBM to nipples liberally  Comfort gels when not using the coconut oil  If the breast are to full to start - hand express ore use hand pump to express off 15 ml  So the nipple / areola complex can sandwich better into the baby's mouth.  Goal - enhance depth at the breast and counteract the baby's high palate - short frenulum. Breast compressions with latch until swallows and comfort has been achieved  Tongue - tie resource given and websites  Red- flag - if the steps ( tips ) for latching don't improve the pain ( SN'S) - LC highly recommends and oral assessment by an oral specialist.

## 2018-04-08 ENCOUNTER — Other Ambulatory Visit: Payer: Self-pay | Admitting: Otolaryngology

## 2018-04-08 DIAGNOSIS — J358 Other chronic diseases of tonsils and adenoids: Secondary | ICD-10-CM

## 2018-04-08 DIAGNOSIS — R591 Generalized enlarged lymph nodes: Secondary | ICD-10-CM

## 2018-04-17 ENCOUNTER — Ambulatory Visit
Admission: RE | Admit: 2018-04-17 | Discharge: 2018-04-17 | Disposition: A | Discharge status: Home / Self Care | Payer: BLUE CROSS/BLUE SHIELD | Source: Ambulatory Visit | Attending: Otolaryngology | Admitting: Otolaryngology

## 2018-04-17 DIAGNOSIS — R591 Generalized enlarged lymph nodes: Secondary | ICD-10-CM

## 2018-04-17 DIAGNOSIS — J358 Other chronic diseases of tonsils and adenoids: Secondary | ICD-10-CM

## 2018-04-17 MED ORDER — IOPAMIDOL (ISOVUE-300) INJECTION 61%
75.0000 mL | Freq: Once | INTRAVENOUS | Status: AC | PRN
  Administered 2018-04-17: 75 mL via INTRAVENOUS

## 2018-04-19 ENCOUNTER — Other Ambulatory Visit: Payer: Self-pay | Admitting: Otolaryngology

## 2018-04-19 DIAGNOSIS — R590 Localized enlarged lymph nodes: Secondary | ICD-10-CM

## 2018-04-19 DIAGNOSIS — J358 Other chronic diseases of tonsils and adenoids: Secondary | ICD-10-CM

## 2018-04-20 ENCOUNTER — Other Ambulatory Visit (HOSPITAL_COMMUNITY): Payer: Self-pay | Admitting: Otolaryngology

## 2018-04-20 DIAGNOSIS — R590 Localized enlarged lymph nodes: Secondary | ICD-10-CM

## 2018-04-20 DIAGNOSIS — J358 Other chronic diseases of tonsils and adenoids: Secondary | ICD-10-CM

## 2018-04-21 ENCOUNTER — Other Ambulatory Visit: Payer: BLUE CROSS/BLUE SHIELD

## 2018-04-25 ENCOUNTER — Ambulatory Visit (HOSPITAL_COMMUNITY): Payer: BLUE CROSS/BLUE SHIELD

## 2018-04-26 ENCOUNTER — Ambulatory Visit (HOSPITAL_COMMUNITY)
Admission: RE | Admit: 2018-04-26 | Discharge: 2018-04-26 | Disposition: A | Discharge status: Home / Self Care | Payer: BLUE CROSS/BLUE SHIELD | Source: Ambulatory Visit | Attending: Otolaryngology | Admitting: Otolaryngology

## 2018-04-26 ENCOUNTER — Other Ambulatory Visit: Payer: Self-pay | Admitting: Radiology

## 2018-04-26 ENCOUNTER — Other Ambulatory Visit: Payer: Self-pay | Admitting: Physician Assistant

## 2018-04-26 DIAGNOSIS — R590 Localized enlarged lymph nodes: Secondary | ICD-10-CM | POA: Diagnosis not present

## 2018-04-26 DIAGNOSIS — J358 Other chronic diseases of tonsils and adenoids: Secondary | ICD-10-CM

## 2018-04-27 ENCOUNTER — Encounter (HOSPITAL_COMMUNITY): Payer: Self-pay | Admitting: *Deleted

## 2018-04-27 ENCOUNTER — Ambulatory Visit (HOSPITAL_COMMUNITY)
Admission: RE | Admit: 2018-04-27 | Discharge: 2018-04-27 | Disposition: A | Discharge status: Home / Self Care | Payer: BLUE CROSS/BLUE SHIELD | Source: Ambulatory Visit | Attending: Otolaryngology | Admitting: Otolaryngology

## 2018-04-27 ENCOUNTER — Other Ambulatory Visit: Payer: Self-pay

## 2018-04-27 DIAGNOSIS — J358 Other chronic diseases of tonsils and adenoids: Secondary | ICD-10-CM | POA: Diagnosis present

## 2018-04-27 DIAGNOSIS — R590 Localized enlarged lymph nodes: Secondary | ICD-10-CM | POA: Diagnosis present

## 2018-04-27 LAB — CBC
HCT: 42.3 % (ref 36.0–46.0)
Hemoglobin: 13.9 g/dL (ref 12.0–15.0)
MCH: 29.1 pg (ref 26.0–34.0)
MCHC: 32.9 g/dL (ref 30.0–36.0)
MCV: 88.7 fL (ref 80.0–100.0)
NRBC: 0 % (ref 0.0–0.2)
PLATELETS: 314 10*3/uL (ref 150–400)
RBC: 4.77 MIL/uL (ref 3.87–5.11)
RDW: 11.5 % (ref 11.5–15.5)
WBC: 17.3 10*3/uL — AB (ref 4.0–10.5)

## 2018-04-27 LAB — PREGNANCY, URINE: PREG TEST UR: NEGATIVE

## 2018-04-27 LAB — PROTIME-INR
INR: 1.03
PROTHROMBIN TIME: 13.4 s (ref 11.4–15.2)

## 2018-04-27 LAB — APTT: APTT: 26 s (ref 24–36)

## 2018-04-27 MED ORDER — SODIUM CHLORIDE 0.9 % IV SOLN: INTRAVENOUS | Status: DC

## 2018-04-27 MED ORDER — LIDOCAINE HCL (PF) 1 % IJ SOLN
INTRAMUSCULAR | Status: AC
  Filled 2018-04-27: qty 30

## 2018-04-27 NOTE — Procedures (Signed)
Enlarged Rt cervical LN  S/p US bx  No comp Stable Path pending Full report in pacs

## 2018-04-27 NOTE — Progress Notes (Signed)
Instructed pt to obtain urine and call us when she does. Voices understanding.

## 2018-04-27 NOTE — H&P (Signed)
Chief Complaint: Patient was seen in consultation today for right cervical lymph node biopsy at the request of Graylin Shiver  Referring Physician(s): Graylin Shiver  Supervising Physician: Ruel Favors  Patient Status: [facility] - Out-pt  History of Present Illness: Krystal Stanley is a 37 y.o. female   Rt cervical LN enlarged x 4 months now PMD requested CT and [place] Nonsmoker Denies pain or Sore throat  [place] yesterday FINDINGS: Targeted ultrasound of the right neck demonstrates a homogeneously hypoechoic structure measuring 3.8 x 0.7 x 1.7 cm likely corresponding to the jugulodigastric lymph node on the recent CT. The node has an elongated morphology though a normal fatty hilum is not clearly depicted. IMPRESSION: Persistent mildly enlarged right jugulodigastric lymph node, indeterminate  Scheduled now for biopsy of same    Past Medical History:  Diagnosis Date  . Abnormal Pap smear 2009   COLPO; LEEP;  NL SINCE LAST PAP 04/2011  . Asthma AGE 67   INHALER PRN  . GERD (gastroesophageal reflux disease)    with pregnancy  . Infection CHILDHOOD   UTI  . Oligohydramnios 08/07/2012  . Placenta abruption, delivered, current hospitalization 08/24/2012  . Preterm delivery 08/24/2012  . Shortness of breath     Past Surgical History:  Procedure Laterality Date  . COLPOSCOPY    . LAPAROSCOPY Left 11/11/2015   Procedure: LAPAROSCOPY DIAGNOSTIC;  Surgeon: Hoover Browns, MD;  Location: [facility];  Service: Gynecology;  Laterality: Left;  . LEEP    . UNILATERAL SALPINGECTOMY Right 11/11/2015   Procedure: UNILATERAL SALPINGECTOMY;  Surgeon: Hoover Browns, MD;  Location: [facility];  Service: Gynecology;  Laterality: Right;  . WISDOM TOOTH EXTRACTION  AS TEEN    Allergies: Other  Medications: Prior to Admission medications   Medication Sig Start Date End Date Taking? Authorizing Provider  albuterol (PROVENTIL HFA;VENTOLIN HFA) 108 (90 BASE) MCG/ACT inhaler Inhale 2 puffs into the  lungs every 4 (four) hours as needed. Patient taking differently: Inhale 2 puffs into the lungs every 4 (four) hours as needed for wheezing or shortness of breath.  07/03/12  Yes Dillard, Naima, MD  cetirizine (ZYRTEC) 10 MG tablet Take 10 mg by mouth daily.   Yes [provider]  montelukast (SINGULAIR) 10 MG tablet Take 1 tablet (10 mg total) by mouth at bedtime. 07/03/12  Yes DillardSamule Ohm, MD     Family History  Problem Relation Age of Onset  . Arthritis Maternal Grandmother   . Hyperlipidemia Maternal Grandmother   . Heart disease Maternal Grandmother   . Arthritis Maternal Grandfather   . Alcohol abuse Maternal Grandfather   . Arthritis Paternal Grandmother   . Arthritis Paternal Grandfather   . Alcohol abuse Paternal Grandfather   . Hyperlipidemia Paternal Grandfather   . Heart disease Paternal Grandfather   . Early death Brother 51       ACCIDENT  . Rheum arthritis Maternal Aunt   . Fibromyalgia Maternal Aunt     Social History   Socioeconomic History  . Marital status: Married    Spouse name: ERIC  . Number of children: 0  . Years of education: 42  . Highest education level: Not on file  Occupational History  . Occupation: ATHLETIC TRAINER  Social Needs  . Financial resource strain: Not on file  . Food insecurity:    Worry: Not on file    Inability: Not on file  . Transportation needs:    Medical: Not on file    Non-medical: Not on file  Tobacco Use  . Smoking status: Never Smoker  . Smokeless tobacco: Never Used  Substance and Sexual Activity  . Alcohol use: No    Alcohol/week: 7.0 standard drinks    Types: 7 Standard drinks or equivalent per week    Comment: social occasional  . Drug use: No  . Sexual activity: Yes    Partners: Male    Birth control/protection: OCP, None    Comment: D/C'D 07/2011  Lifestyle  . Physical activity:    Days per week: Not on file    Minutes per session: Not on file  . Stress: Not on file  Relationships  .  Social connections:    Talks on phone: Not on file    Gets together: Not on file    Attends religious service: Not on file    Active member of club or organization: Not on file    Attends meetings of clubs or organizations: Not on file    Relationship status: Not on file  Other Topics Concern  . Not on file  Social History Narrative  . Not on file    Review of Systems: A 12 point ROS discussed and pertinent positives are indicated in the HPI above.  All other systems are negative.  Review of Systems  Constitutional: Negative for activity change and fever.  HENT: Negative for ear pain, facial swelling, hearing loss, mouth sores, tinnitus and trouble swallowing.   Respiratory: Negative for cough and shortness of breath.   Cardiovascular: Negative for chest pain.  Gastrointestinal: Negative for abdominal pain.  Neurological: Negative for weakness.  Psychiatric/Behavioral: Negative for behavioral problems and confusion.    Vital Signs: BP 133/88   Pulse 65   Temp 98 F (36.7 C)   Ht 5\' 10"  (1.778 m)   Wt 160 lb (72.6 kg)   LMP 04/07/2018   SpO2 98%   BMI 22.96 kg/m   Physical Exam  Constitutional: She is oriented to person, place, and time.  Cardiovascular: Normal rate, regular rhythm and normal heart sounds.  Pulmonary/Chest: Effort normal and breath sounds normal.  Abdominal: Soft. Bowel sounds are normal.  Musculoskeletal: Normal range of motion.  Neurological: She is alert and oriented to person, place, and time.  Skin: Skin is warm and dry.  Psychiatric: She has a normal mood and affect. Her behavior is normal. Judgment and thought content normal.  Vitals reviewed.   Imaging: Ct Soft Tissue Neck W Contrast  Result Date: 04/17/2018 CLINICAL DATA:  Lymphadenopathy since summer of 2019. tonsil asymmetry EXAM: CT NECK WITH CONTRAST TECHNIQUE: Multidetector CT imaging of the neck was performed using the standard protocol following the bolus administration of  intravenous contrast. CONTRAST:  75mL ISOVUE-300 IOPAMIDOL (ISOVUE-300) INJECTION 61% COMPARISON:  None. FINDINGS: Pharynx and larynx: The right palatine tonsil is thicker than the right, without asymmetric enhancement. No submucosal mass is seen. Salivary glands: Negative Thyroid: Negative Lymph nodes: The right jugulodigastric node is larger than the left, 2.7 x 1.3 cm. This and all the nodes are homogeneously enhancing. Vascular: Negative Limited intracranial: Negative Visualized orbits: Negative Mastoids and visualized paranasal sinuses: Rightward nasal septal spurring. Skeleton: Negative Upper chest: Visible thymus without definite thickening or nodularity. IMPRESSION: 1. Mild asymmetric enlargement of the right palatine tonsil which would be better assessed by mucosal exam. No submucosal mass or asymmetric enhancement. 2. Palpable marker on the right correlates with a mildly enlarged jugulodigastric node that is likely reactive (assuming benign exam). Electronically Signed   By: Kathrynn DuckingJonathon  Watts M.D.  On: 04/17/2018 09:39   US Soft Tissue Head & Neck (non-thyroid)  Result Date: 04/26/2018 CLINICAL DATA:  Cervical adenopathy.  Planned biopsy. EXAM: ULTRASOUND OF HEAD/NECK SOFT TISSUES TECHNIQUE: Ultrasound examination of the head and neck soft tissues was performed in the area of clinical concern. COMPARISON:  Neck CT 04/17/2018 FINDINGS: Targeted ultrasound of the right neck demonstrates a homogeneously hypoechoic structure measuring 3.8 x 0.7 x 1.7 cm likely corresponding to the jugulodigastric lymph node on the recent CT. The node has an elongated morphology though a normal fatty hilum is not clearly depicted. IMPRESSION: Persistent mildly enlarged right jugulodigastric lymph node, indeterminate. Electronically Signed   By: Sebastian Ache M.D.   On: 04/26/2018 13:58    Labs:  CBC: No results for input(s): WBC, HGB, HCT, PLT in the last 8760 hours.  COAGS: No results for input(s): INR, APTT in the  last 8760 hours.  BMP: No results for input(s): NA, K, CL, CO2, GLUCOSE, BUN, CALCIUM, CREATININE, GFRNONAA, GFRAA in the last 8760 hours.  Invalid input(s): CMP  LIVER FUNCTION TESTS: No results for input(s): BILITOT, AST, ALT, ALKPHOS, PROT, ALBUMIN in the last 8760 hours.  TUMOR MARKERS: No results for input(s): AFPTM, CEA, CA199, CHROMGRNA in the last 8760 hours.  Assessment and Plan:  Rt cervical LAN- persistent for months US reveals LAN Scheduled for Rt cervical lymph node biopsy Risks and benefits discussed with the patient including, but not limited to bleeding, infection, damage to adjacent structures or low yield requiring additional tests.  All of the patient's questions were answered, patient is agreeable to proceed. Consent signed and in chart.   Thank you for this interesting consult.  I greatly enjoyed meeting Krystal Stanley and look forward to participating in their care.  A copy of this report was sent to the requesting provider on this date.  Electronically Signed: Robet Leu, PA-C 04/27/2018, 11:41 AM   I spent a total of    25 Minutes in face to face in clinical consultation, greater than 50% of which was counseling/coordinating care for right cervical LN bx

## 2018-05-23 ENCOUNTER — Other Ambulatory Visit: Payer: Self-pay | Admitting: Otolaryngology
